# Patient Record
Sex: Male | Born: 1980 | Race: Black or African American | Hispanic: No | Marital: Married | Smoking: Never smoker
Health system: Southern US, Community
[De-identification: ages and names within clinical notes are randomized; demographics above are authoritative.]

## PROBLEM LIST (undated history)

## (undated) DIAGNOSIS — I10 Essential (primary) hypertension: Secondary | ICD-10-CM

## (undated) DIAGNOSIS — K219 Gastro-esophageal reflux disease without esophagitis: Secondary | ICD-10-CM

## (undated) HISTORY — DX: Gastro-esophageal reflux disease without esophagitis: K21.9

---

## 2001-01-19 ENCOUNTER — Emergency Department (HOSPITAL_COMMUNITY): Admission: EM | Admit: 2001-01-19 | Discharge: 2001-01-19 | Payer: Self-pay | Admitting: Emergency Medicine

## 2001-01-19 ENCOUNTER — Encounter: Payer: Self-pay | Admitting: Emergency Medicine

## 2004-08-22 ENCOUNTER — Emergency Department (HOSPITAL_COMMUNITY): Admission: EM | Admit: 2004-08-22 | Discharge: 2004-08-22 | Payer: Self-pay | Admitting: Family Medicine

## 2011-09-06 ENCOUNTER — Emergency Department (HOSPITAL_COMMUNITY)
Admission: EM | Admit: 2011-09-06 | Discharge: 2011-09-06 | Disposition: A | Discharge status: Home / Self Care | Payer: Self-pay | Attending: Emergency Medicine | Admitting: Emergency Medicine

## 2011-09-06 ENCOUNTER — Encounter (HOSPITAL_COMMUNITY): Payer: Self-pay | Admitting: Emergency Medicine

## 2011-09-06 DIAGNOSIS — K5289 Other specified noninfective gastroenteritis and colitis: Secondary | ICD-10-CM | POA: Insufficient documentation

## 2011-09-06 DIAGNOSIS — R630 Anorexia: Secondary | ICD-10-CM | POA: Insufficient documentation

## 2011-09-06 DIAGNOSIS — R1031 Right lower quadrant pain: Secondary | ICD-10-CM | POA: Insufficient documentation

## 2011-09-06 DIAGNOSIS — R197 Diarrhea, unspecified: Secondary | ICD-10-CM | POA: Insufficient documentation

## 2011-09-06 DIAGNOSIS — R112 Nausea with vomiting, unspecified: Secondary | ICD-10-CM | POA: Insufficient documentation

## 2011-09-06 DIAGNOSIS — K529 Noninfective gastroenteritis and colitis, unspecified: Secondary | ICD-10-CM

## 2011-09-06 LAB — COMPREHENSIVE METABOLIC PANEL
ALT: 13 U/L (ref 0–53)
AST: 18 U/L (ref 0–37)
Albumin: 4 g/dL (ref 3.5–5.2)
Alkaline Phosphatase: 86 U/L (ref 39–117)
Calcium: 10.1 mg/dL (ref 8.4–10.5)
GFR calc Af Amer: >90 mL/min (ref >90)
Glucose, Bld: 96 mg/dL (ref 70–99)
Potassium: 4.1 mEq/L (ref 3.5–5.1)
Sodium: 136 mEq/L (ref 135–145)
Total Protein: 7.9 g/dL (ref 6.0–8.3)

## 2011-09-06 LAB — DIFFERENTIAL
Basophils Absolute: 0.1 10*3/uL (ref 0.0–0.1)
Basophils Relative: 1 % (ref 0–1)
Eosinophils Absolute: 0.3 10*3/uL (ref 0.0–0.7)
Eosinophils Relative: 2 % (ref 0–5)
Lymphs Abs: 3.5 10*3/uL (ref 0.7–4.0)
Neutrophils Relative %: 60 % (ref 43–77)

## 2011-09-06 LAB — CBC
MCH: 29.8 pg (ref 26.0–34.0)
MCHC: 35.4 g/dL (ref 30.0–36.0)
MCV: 84.2 fL (ref 78.0–100.0)
Platelets: 268 10*3/uL (ref 150–400)
RBC: 5.57 MIL/uL (ref 4.22–5.81)
RDW: 12.9 % (ref 11.5–15.5)

## 2011-09-06 LAB — URINALYSIS, ROUTINE W REFLEX MICROSCOPIC
Bilirubin Urine: NEGATIVE
Glucose, UA: NEGATIVE mg/dL
Ketones, ur: NEGATIVE mg/dL
Nitrite: NEGATIVE
Specific Gravity, Urine: 1.021 (ref 1.005–1.030)
pH: 6 (ref 5.0–8.0)

## 2011-09-06 MED ORDER — SODIUM CHLORIDE 0.9 % IV BOLUS (SEPSIS): 1000.0000 mL | Freq: Once | INTRAVENOUS | Status: DC

## 2011-09-06 MED ORDER — ONDANSETRON HCL 4 MG/2ML IJ SOLN
4.0000 mg | Freq: Once | INTRAMUSCULAR | Status: AC
  Administered 2011-09-06: 4 mg via INTRAVENOUS
  Filled 2011-09-06: qty 2

## 2011-09-06 NOTE — ED Notes (Signed)
Pt tolerating oral trial

## 2011-09-06 NOTE — ED Notes (Signed)
Pt c/o n/v/d that began on new year's eve and has persisted since then.  He is worried about possible dehydration. A/O x 3.  No c/o pain at this time. Some tenderness noted on palpation to the RUQ.  Pt has had no previous abdominal disease processes or surgeries.

## 2011-09-06 NOTE — ED Provider Notes (Signed)
4:37 PM Patient placed in CDU for completion of diagnostic testing and IV fluids.  Patient with history of nausea, vomiting, diarrhea.  Last episode of emesis and diarrhea this morning.  Labs reviewed, no acute abnormality noted.  Patient resting comfortably at present, IV bolus in progress.  7:04 PM  Patient tolerating po fluids without difficulty.  Continues to be free of abdominal pain.  Patient states he feels ready to return home.   Jimmye Norman, NP 09/06/11 1905

## 2011-09-06 NOTE — ED Notes (Signed)
Report given and care endorsed to [facility], [place]. Pt prepared for transport.

## 2011-09-06 NOTE — ED Provider Notes (Signed)
History     CSN: [id number]  Arrival date & time 09/06/11  1151   First MD Initiated Contact with Patient 09/06/11 1505      Chief Complaint  Patient presents with  . Abdominal Pain    (Consider location/radiation/quality/duration/timing/severity/associated sxs/prior treatment) HPI Patient presents with 3 days of nausea vomiting and diarrhea. He states that he has had intermittent pain in the right side of his abdomen over the past 3 days as well. He denies having any pain currently. Emesis is nonbilious and nonbloody. He has had no fever. He has had 2 episodes of emesis earlier today and has not been able to keep down liquids. He describes 4 episodes of watery stool overnight. There's no blood in his stool. He does not know of any sick contacts. He denies any significant past medical history and denies any new medications. There no associated systemic symptoms. There no other alleviating or modifying factors.  Symptoms are described as continuous, and mild to moderate  History reviewed. No pertinent past medical history.  History reviewed. No pertinent past surgical history.  History reviewed. No pertinent family history.  History  Substance Use Topics  . Smoking status: Never Smoker   . Smokeless tobacco: Never Used  . Alcohol Use: Yes      Review of Systems ROS reviewed and otherwise negative except for mentioned in HPI  Allergies  Review of patient's allergies indicates no known allergies.  Home Medications   Current Outpatient Rx  Name Route Sig Dispense Refill  . DIPHENHYDRAMINE HCL 25 MG PO TABS Oral Take 25 mg by mouth every 8 (eight) hours as needed. Allergies      . LOPERAMIDE HCL 2 MG PO CAPS Oral Take 2 mg by mouth 4 (four) times daily as needed. Diarrhea.     . OMEPRAZOLE 20 MG PO CPDR Oral Take 20 mg by mouth daily as needed. Acid reflux       BP 136/84  Pulse 63  Temp(Src) 98.4 F (36.9 C) (Oral)  Resp 20  Ht 5\' 10"  (1.778 m)  Wt 150 lb (68.04 kg)   BMI 21.52 kg/m2  SpO2 99% Vitals reviewed Physical Exam Physical Examination: General appearance - alert, well appearing, and in no distress Mental status - alert, oriented to person, place, and time Eyes - pupils equal and reactive, extraocular eye movements intact Mouth - mucous membranes moist, pharynx normal without lesions Chest - clear to auscultation, no wheezes, rales or rhonchi, symmetric air entry Heart - normal rate, regular rhythm, normal S1, S2, no murmurs, rubs, clicks or gallops Abdomen - soft, minimal ttp in RLQ, no gaurding, no rebound, nondistended, no masses or organomegaly Musculoskeletal - no joint tenderness, deformity or swelling Extremities - peripheral pulses normal, no pedal edema, no clubbing or cyanosis Skin - normal coloration and turgor, no rashes  ED Course  Procedures (including critical care time) 3:54 PM pt moved to CDU, report given to Felicie Morn- who will continue management under my supervision.   Labs Reviewed  CBC - Abnormal; Notable for the following:    WBC 11.4 (*)    All other components within normal limits  DIFFERENTIAL  COMPREHENSIVE METABOLIC PANEL  URINALYSIS, ROUTINE W REFLEX MICROSCOPIC  LIPASE, BLOOD   No results found.   1. Gastroenteritis       MDM  Patient presenting with nausea vomiting and diarrhea. He has had intermittent cramping lower abdominal pain as well. He was hydrated with IV saline as well as given antibiotics. His  labs were reassuring. He tolerated fluid challenge in the ED prior to discharge and was feeling improved. He was discharged with strict return precautions and is agreeable with the plan.        Ethelda Chick, MD 09/06/11 1950

## 2011-09-06 NOTE — ED Provider Notes (Signed)
Medical screening examination/treatment/procedure(s) were conducted as a shared visit with non-physician practitioner(s) and myself.  I personally evaluated the patient during the encounter  Pt was seen primarily by me in triage.   Ethelda Chick, MD 09/06/11 [phone]

## 2011-09-06 NOTE — ED Notes (Signed)
Pt discharged home with family and friends. All sections of the discharge instructions were discussed. Pt stated that he understood and had no questions/concerns.

## 2011-09-06 NOTE — ED Notes (Signed)
Abdominal pain with n/v/d since New Years Eve

## 2019-03-18 ENCOUNTER — Other Ambulatory Visit: Payer: Self-pay | Admitting: Internal Medicine

## 2019-03-21 LAB — NOVEL CORONAVIRUS, NAA: SARS-CoV-2, NAA: NOT DETECTED

## 2019-06-25 ENCOUNTER — Encounter (HOSPITAL_COMMUNITY): Payer: Self-pay

## 2019-06-25 ENCOUNTER — Other Ambulatory Visit: Payer: Self-pay

## 2019-06-25 ENCOUNTER — Ambulatory Visit (HOSPITAL_COMMUNITY)
Admission: EM | Admit: 2019-06-25 | Discharge: 2019-06-25 | Disposition: A | Discharge status: Home / Self Care | Payer: Self-pay | Attending: Family Medicine | Admitting: Family Medicine

## 2019-06-25 DIAGNOSIS — R05 Cough: Secondary | ICD-10-CM | POA: Insufficient documentation

## 2019-06-25 DIAGNOSIS — U071 COVID-19: Secondary | ICD-10-CM | POA: Insufficient documentation

## 2019-06-25 DIAGNOSIS — R6883 Chills (without fever): Secondary | ICD-10-CM | POA: Insufficient documentation

## 2019-06-25 DIAGNOSIS — R059 Cough, unspecified: Secondary | ICD-10-CM

## 2019-06-25 DIAGNOSIS — I10 Essential (primary) hypertension: Secondary | ICD-10-CM | POA: Insufficient documentation

## 2019-06-25 MED ORDER — CLONIDINE HCL 0.1 MG PO TABS
ORAL_TABLET | ORAL | Status: AC
  Filled 2019-06-25: qty 1

## 2019-06-25 MED ORDER — CLONIDINE HCL 0.1 MG PO TABS
0.1000 mg | ORAL_TABLET | Freq: Once | ORAL | Status: AC
  Administered 2019-06-25: 14:00:00 0.1 mg via ORAL

## 2019-06-25 MED ORDER — AMLODIPINE BESYLATE 5 MG PO TABS: 5.0000 mg | ORAL_TABLET | Freq: Every day | ORAL | 0 refills | Status: DC

## 2019-06-25 NOTE — Discharge Instructions (Signed)
Your Covid swab is pending, we will call if it is positive In the meantime please rest, drink fluids, Tylenol for body aches  Your blood pressure was elevated today in clinic.  We gave you clonidine.  Please start amlodipine 5 mg daily, go ahead and contact primary care either this afternoon or in the morning to get set up with a follow-up appointment in the next 1 to 2 weeks.  Please go to Emergency Room if you start to experience severe headache, vision changes, decreased urine production, chest pain, shortness of breath, speech slurring, one sided weakness.

## 2019-06-25 NOTE — ED Provider Notes (Signed)
[name]    CSN: [id number] Arrival date & time: 06/25/19  1235      History   Chief Complaint Chief Complaint  Patient presents with  . Chills    HPI Jeremiah [name] is a 38 y.o. male history of hypertension presenting today for evaluation of chills, lack of appetite.  Patient states that for the past 6 days he has had decreased appetite as well as hot and cold chills.  He has had a mild cough as well.  He denies any known measured fevers.  Denies any known exposure to Covid.  Patient did recently travel to and from [place].  He has been using Hall's as well as Coricidin for his cold symptoms.  Of note his blood pressure is very elevated today.  He does admit that he has a history of hypertension.  Has previously been on medicines, but states that he has not taken them since March as he was told he no longer needed medicines.  He is unsure of which medicines he was previously on.  He does believe he is taking amlodipine before without issue.  He denies headaches, vision changes, chest pain or shortness of breath.  He does have some occasional intermittent mild headaches that are located retro-ocularly.    HPI  History reviewed. No pertinent past medical history.  There are no active problems to display for this patient.   History reviewed. No pertinent surgical history.     Home Medications    Prior to Admission medications   Medication Sig Start Date End Date Taking? Authorizing Provider  amLODipine (NORVASC) 5 MG tablet Take 1 tablet (5 mg total) by mouth daily. 06/25/19   Wieters, Hallie C, PA-C  diphenhydrAMINE (BENADRYL) 25 MG tablet Take 25 mg by mouth every 8 (eight) hours as needed. Allergies      [provider]  loperamide (IMODIUM) 2 MG capsule Take 2 mg by mouth 4 (four) times daily as needed. Diarrhea.     [provider]  omeprazole (PRILOSEC) 20 MG capsule Take 20 mg by mouth daily as needed. Acid reflux     [provider]    Family History Family History  Problem Relation Age of Onset  . Hypertension Father     Social History Social History   Tobacco Use  . Smoking status: Never Smoker  . Smokeless tobacco: Never Used  Substance Use Topics  . Alcohol use: Yes  . Drug use: No     Allergies   Patient has no known allergies.   Review of Systems Review of Systems  Constitutional: Positive for appetite change, chills and fatigue. Negative for activity change and fever.  HENT: Negative for congestion, ear pain, rhinorrhea, sinus pressure, sore throat and trouble swallowing.   Eyes: Negative for photophobia, pain, discharge, redness and visual disturbance.  Respiratory: Positive for cough. Negative for chest tightness and shortness of breath.   Cardiovascular: Negative for chest pain.  Gastrointestinal: Negative for abdominal pain, diarrhea, nausea and vomiting.  Genitourinary: Negative for decreased urine volume and hematuria.  Musculoskeletal: Negative for myalgias, neck pain and neck stiffness.  Skin: Negative for rash.  Neurological: Negative for dizziness, syncope, facial asymmetry, speech difficulty, weakness, light-headedness, numbness and headaches.     Physical Exam Triage Vital Signs ED Triage Vitals  Enc Vitals Group     BP 06/25/19 1312 (!) 192/135     Pulse Rate 06/25/19 1312 89     Resp 06/25/19 1312 18  Temp 06/25/19 1312 98.1 F (36.7 C)     Temp Source 06/25/19 1312 Tympanic     SpO2 06/25/19 1312 96 %     Weight --      Height --      Head Circumference --      Peak Flow --      Pain Score 06/25/19 1317 0     Pain Loc --      Pain Edu? --      Excl. in GC? --    No data found.  Updated Vital Signs BP (!) 192/135 Comment: bp recheck  Pulse 89   Temp 98.1 F (36.7 C) (Tympanic)   Resp 18   SpO2 96%   Visual Acuity Right Eye Distance:   Left Eye Distance:   Bilateral Distance:    Right Eye Near:   Left Eye Near:    Bilateral Near:      Physical Exam Vitals signs and nursing note reviewed.  Constitutional:      Appearance: He is well-developed.  HENT:     Head: Normocephalic and atraumatic.     Ears:     Comments: Bilateral ears without tenderness to palpation of external auricle, tragus and mastoid, EAC's without erythema or swelling, TM's with good bony landmarks and cone of light. Non erythematous.     Mouth/Throat:     Comments: Oral mucosa pink and moist, no tonsillar enlargement or exudate. Posterior pharynx patent and nonerythematous, no uvula deviation or swelling. Normal phonation.  Eyes:     Extraocular Movements: Extraocular movements intact.     Conjunctiva/sclera: Conjunctivae normal.     Pupils: Pupils are equal, round, and reactive to light.  Neck:     Musculoskeletal: Neck supple.  Cardiovascular:     Rate and Rhythm: Normal rate and regular rhythm.     Heart sounds: No murmur.  Pulmonary:     Effort: Pulmonary effort is normal. No respiratory distress.     Breath sounds: Normal breath sounds.     Comments: Breathing comfortably at rest, CTABL, no wheezing, rales or other adventitious sounds auscultated Abdominal:     Palpations: Abdomen is soft.     Tenderness: There is no abdominal tenderness.  Skin:    General: Skin is warm and dry.  Neurological:     General: No focal deficit present.     Mental Status: He is alert. Mental status is at baseline.     Cranial Nerves: No cranial nerve deficit.     Motor: No weakness.     Gait: Gait normal.      UC Treatments / Results  Labs (all labs ordered are listed, but only abnormal results are displayed) Labs Reviewed  NOVEL CORONAVIRUS, NAA (HOSP ORDER, SEND-OUT TO REF LAB; TAT 18-24 HRS)    EKG   Radiology No results found.  Procedures Procedures (including critical care time)  Medications Ordered in UC Medications  cloNIDine (CATAPRES) tablet 0.1 mg (0.1 mg Oral Given 06/25/19 1350)  cloNIDine (CATAPRES) 0.1 MG tablet (has no  administration in time range)  cloNIDine (CATAPRES) 0.1 MG tablet (has no administration in time range)    Initial Impression / Assessment and Plan / UC Course  I have reviewed the triage vital signs and the nursing notes.  Pertinent labs & imaging results that were available during my care of the patient were reviewed by me and considered in my medical decision making (see chart for details).     Patient with mild URI  symptoms along with chills and decreased appetite, Covid swab pending.  Recommending symptomatic and supportive care of this.  Continue to monitor for gradual resolution.  Blood pressure significantly elevated today, provided clonidine 0.1 in clinic, monitored for approximately 30 minutes, no immediate improvement in blood pressure, patient expressed desire to leave.  Discussed risks associated with this, but given patient is relatively asymptomatic currently we will have him continue to monitor his blood pressure at home which he verbalized understanding to do.  Initiating on amlodipine 5 mg daily, stressed importance of establishing care with primary care for blood pressure recheck and hypertension management as he likely needs an increased dose/additional medicine for better control.  Discussed warning signs and red flags to follow-up in emergency room.  Discussed strict return precautions. Patient verbalized understanding and is agreeable with plan.  Final Clinical Impressions(s) / UC Diagnoses   Final diagnoses:  Chills  Essential hypertension  Cough     Discharge Instructions     Your Covid swab is pending, we will call if it is positive In the meantime please rest, drink fluids, Tylenol for body aches  Your blood pressure was elevated today in clinic.  We gave you clonidine.  Please start amlodipine 5 mg daily, go ahead and contact primary care either this afternoon or in the morning to get set up with a follow-up appointment in the next 1 to 2 weeks.  Please go  to Emergency Room if you start to experience severe headache, vision changes, decreased urine production, chest pain, shortness of breath, speech slurring, one sided weakness.    ED Prescriptions    Medication Sig Dispense Auth. Provider   amLODipine (NORVASC) 5 MG tablet Take 1 tablet (5 mg total) by mouth daily. 60 tablet Wieters, [place] C, PA-C     PDMP not reviewed this encounter.   Lew DawesWieters, Hallie C, [place]PA-C 06/25/19 1519

## 2019-06-25 NOTE — ED Triage Notes (Signed)
Patient presents to Urgent Care with complaints of loss of taste and smell and occasional chills and hot flashes since 6 days ago. Patient reports he just returned from [place].

## 2019-06-27 ENCOUNTER — Emergency Department (HOSPITAL_COMMUNITY)
Admission: EM | Admit: 2019-06-27 | Discharge: 2019-06-27 | Disposition: A | Discharge status: Home / Self Care | Payer: Self-pay | Attending: Emergency Medicine | Admitting: Emergency Medicine

## 2019-06-27 ENCOUNTER — Other Ambulatory Visit: Payer: Self-pay

## 2019-06-27 ENCOUNTER — Encounter (HOSPITAL_COMMUNITY): Payer: Self-pay

## 2019-06-27 ENCOUNTER — Emergency Department (HOSPITAL_COMMUNITY): Payer: Self-pay

## 2019-06-27 DIAGNOSIS — U071 COVID-19: Secondary | ICD-10-CM | POA: Insufficient documentation

## 2019-06-27 LAB — NOVEL CORONAVIRUS, NAA (HOSP ORDER, SEND-OUT TO REF LAB; TAT 18-24 HRS): SARS-CoV-2, NAA: DETECTED — AB

## 2019-06-27 MED ORDER — BENZONATATE 100 MG PO CAPS: 100.0000 mg | ORAL_CAPSULE | Freq: Three times a day (TID) | ORAL | 0 refills | Status: DC

## 2019-06-27 NOTE — ED Notes (Signed)
Patient did not wait for discharge papers and left through main ED entrance and not EMS bay. Patient was very disappointed that he had to wait roughly 20 to 30 minutes to be discharged due to RN providing care to 2 additional COVID + patients.

## 2019-06-27 NOTE — ED Notes (Signed)
Pt called from lobby with no response. 

## 2019-06-27 NOTE — Discharge Instructions (Addendum)
Please try Delsym over-the-counter for cough relief.  If that still does not help, you may take [employer], as prescribed.  Please also take the Norvasc that was prescribed to you by the urgent care provider for your high blood pressure.  Return to the ED or seek medical attention should he develop any fever uncontrolled with Tylenol or ibuprofen, uncontrolled nausea and vomiting, dizziness, chest pain, or respiratory distress.   If you live with, or provide care at home for, a person confirmed to have, or being evaluated for, COVID-19 infection please follow these guidelines to prevent infection:  Follow healthcare providers instructions Make sure that you understand and can help the patient follow any healthcare provider instructions for all care.  Provide for the patients basic needs You should help the patient with basic needs in the home and provide support for getting groceries, prescriptions, and other personal needs.  Monitor the patients symptoms If they are getting sicker, call his or her medical provider a  This will help the healthcare providers office take steps to keep other people from getting infected. Ask the healthcare provider to call the local or state health department.  Limit the number of people who have contact with the patient If possible, have only one caregiver for the patient. Other household members should stay in another home or place of residence. If this is not possible, they should stay in another room, or be separated from the patient as much as possible. Use a separate bathroom, if available. Restrict visitors who do not have an essential need to be in the home.  Keep older adults, very young children, and other sick people away from the patient Keep older adults, very young children, and those who have compromised immune systems or chronic health conditions away from the patient. This includes people with chronic heart, lung, or kidney  conditions, diabetes, and cancer.  Ensure good ventilation Make sure that shared spaces in the home have good air flow, such as from an air conditioner or an opened window, weather permitting.  Wash your hands often Wash your hands often and thoroughly with soap and water for at least 20 seconds. You can use an alcohol based hand sanitizer if soap and water are not available and if your hands are not visibly dirty. Avoid touching your eyes, nose, and mouth with unwashed hands. Use disposable paper towels to dry your hands. If not available, use dedicated cloth towels and replace them when they become wet.  Wear a facemask and gloves Wear a disposable facemask at all times in the room and gloves when you touch or have contact with the patients blood, body fluids, and/or secretions or excretions, such as sweat, saliva, sputum, nasal mucus, vomit, urine, or feces.  Ensure the mask fits over your nose and mouth tightly, and do not touch it during use. Throw out disposable facemasks and gloves after using them. Do not reuse. Wash your hands immediately after removing your facemask and gloves. If your personal clothing becomes contaminated, carefully remove clothing and launder. Wash your hands after handling contaminated clothing. Place all used disposable facemasks, gloves, and other waste in a lined container before disposing them with other household waste. Remove gloves and wash your hands immediately after handling these items.  Do not share dishes, glasses, or other household items with the patient Avoid sharing household items. You should not share dishes, drinking glasses, cups, eating utensils, towels, bedding, or other items After the person uses these items, you should  wash them thoroughly with soap and water.  Wash laundry thoroughly Immediately remove and wash clothes or bedding that have blood, body fluids, and/or secretions or excretions, such as sweat, saliva, sputum, nasal mucus,  vomit, urine, or feces, on them. Wear gloves when handling laundry from the patient. Read and follow directions on labels of laundry or clothing items and detergent. In general, wash and dry with the warmest temperatures recommended on the label.  Clean all areas the individual has used often Clean all touchable surfaces, such as counters, tabletops, doorknobs, bathroom fixtures, toilets, phones, keyboards, tablets, and bedside tables, every day. Also, clean any surfaces that may have blood, body fluids, and/or secretions or excretions on them. Wear gloves when cleaning surfaces the patient has come in contact with. Use a diluted bleach solution (e.g., dilute bleach with 1 part bleach and 10 parts water) or a household disinfectant with a label that says EPA-registered for coronaviruses. To make a bleach solution at home, add 1 tablespoon of bleach to 1 quart (4 cups) of water. For a larger supply, add  cup of bleach to 1 gallon (16 cups) of water. Read labels of cleaning products and follow recommendations provided on product labels. Labels contain instructions for safe and effective use of the cleaning product including precautions you should take when applying the product, such as wearing gloves or eye protection and making sure you have good ventilation during use of the product. Remove gloves and wash hands immediately after cleaning.  Monitor yourself for signs and symptoms of illness Caregivers and household members are considered close contacts, should monitor their health, and will be asked to limit movement outside of the home to the extent possible. Follow the monitoring steps for close contacts listed on the symptom monitoring form.   ? If you have additional questions, contact your local health department or call the epidemiologist on call at [phone] (available 24/7). ? This guidance is subject to change. For the most up-to-date guidance from [facility], please refer to their  website: [employer]

## 2019-06-27 NOTE — ED Triage Notes (Signed)
Pt states that he recently returned from travel in [place]. Pt states that he coughs everytime he deep breathes. Pt was unaware at start of triage, but informed that he is COVID +. Pt states no taste.

## 2019-06-27 NOTE — ED Provider Notes (Addendum)
[facility]   CSN: [id number] Arrival date & time: 06/27/19  1058     History   Chief Complaint Chief Complaint  Patient presents with  . Cough  . COVID+    HPI Jeremiah [name] is a 38 y.o. male with past medical history significant for hypertension who presents to the ED with reports of 1 day history of pleuritic chest pain with deep inspiration.  He reports feeling sick for approximately 8 days and was swabbed 2 days ago at an urgent care for COVID-19 and tested positive.  He lives at home with his husband and reports that they are taking isolation measures.  He reports pain with deep inspiration, particularly following a cough, but is not taking any antitussive medications.  He had been taking Coricidin, but is no longer doing so.  He also endorses loss of taste.  He does not have a PCP at this time.  He denies any fevers, chills, dizziness, headache, squeezing chest pain, respiratory distress, abdominal discomfort, nausea, vomiting, or other symptoms.    HPI  History reviewed. No pertinent past medical history.  There are no active problems to display for this patient.   History reviewed. No pertinent surgical history.      Home Medications    Prior to Admission medications   Medication Sig Start Date End Date Taking? Authorizing Provider  amLODipine (NORVASC) 5 MG tablet Take 1 tablet (5 mg total) by mouth daily. 06/25/19  Yes Wieters, Hallie C, PA-C  diphenhydrAMINE (BENADRYL) 25 MG tablet Take 25 mg by mouth every 8 (eight) hours as needed for itching or allergies.    Yes [provider]  omeprazole (PRILOSEC) 20 MG capsule Take 20 mg by mouth daily as needed (acid reflux).    Yes [provider]  benzonatate (TESSALON) 100 MG capsule Take 1 capsule (100 mg total) by mouth every 8 (eight) hours. 06/27/19   Lorelee New, PA-C    Family History Family History  Problem Relation Age of Onset  .  Hypertension Father     Social History Social History   Tobacco Use  . Smoking status: Never Smoker  . Smokeless tobacco: Never Used  Substance Use Topics  . Alcohol use: Yes  . Drug use: No     Allergies   Patient has no known allergies.   Review of Systems Review of Systems  All other systems reviewed and are negative.    Physical Exam Updated Vital Signs BP (!) 190/139   Pulse 85   Temp 99.5 F (37.5 C) (Oral)   Resp 18   Wt 75.7 kg   SpO2 95%   BMI 23.93 kg/m   Physical Exam Vitals signs and nursing note reviewed. Exam conducted with a chaperone present.  Constitutional:      Appearance: Normal appearance.  HENT:     Head: Normocephalic and atraumatic.  Eyes:     General: No scleral icterus.    Conjunctiva/sclera: Conjunctivae normal.  Neck:     Musculoskeletal: Normal range of motion and neck supple. No neck rigidity.  Cardiovascular:     Rate and Rhythm: Normal rate and regular rhythm.     Pulses: Normal pulses.     Heart sounds: Normal heart sounds.  Pulmonary:     Effort: Pulmonary effort is normal. No respiratory distress.     Breath sounds: Normal breath sounds.     Comments: Breath sounds intact bilaterally. Abdominal:     General: There is  no distension.     Palpations: Abdomen is soft.     Tenderness: There is no abdominal tenderness.  Skin:    General: Skin is dry.  Neurological:     Mental Status: He is alert.     GCS: GCS eye subscore is 4. GCS verbal subscore is 5. GCS motor subscore is 6.  Psychiatric:        Mood and Affect: Mood normal.        Behavior: Behavior normal.        Thought Content: Thought content normal.      ED Treatments / Results  Labs (all labs ordered are listed, but only abnormal results are displayed) Labs Reviewed - No data to display  EKG None  Radiology Dg Chest Portable 1 View  Result Date: 06/27/2019 CLINICAL DATA:  Cough.  COVID-19 positive EXAM: PORTABLE CHEST 1 VIEW COMPARISON:  None.  FINDINGS: Lungs are clear. Heart is upper normal in size with pulmonary vascularity normal. No adenopathy. No bone lesions. IMPRESSION: No edema or consolidation. Heart upper normal in size. No adenopathy evident. Electronically Signed   By: Lowella Grip III M.D.   On: 06/27/2019 15:45    Procedures Procedures (including critical care time)  Medications Ordered in ED Medications - No data to display   Initial Impression / Assessment and Plan / ED Course  I have reviewed the triage vital signs and the nursing notes.  Pertinent labs & imaging results that were available during my care of the patient were reviewed by me and considered in my medical decision making (see chart for details).       Will obtain DG portable chest to help rule out spontaneous pneumothorax or pneumonia given patient's report of new onset pleuritic chest pain with deep inspiration.  Plain films were reviewed and demonstrate no evidence of focal consolidation concerning for pneumonia or evidence of pneumothorax.  Patient is approximately day 8 or 9 intercourse and I informed him that that is typically when symptoms peak.   Patient denies any history of clots or any history of clotting disorder, cancer, or HIV.  He denies any lower leg swelling or hemoptysis.  He is neither tachycardic nor tachypneic and he is oxygenating 99% on room air.  He is PERC negative and do not believe that CTA pulmonary is necessary at this time.  Recommend that he try Delsym OTC for cough relief.  We will also prescribe him Ladona Ridgel given his lack of outpatient follow-up.  Instructed to continue Tylenol as needed for any fevers and chills.  Patient had also been prescribed Norvasc 2 days ago by [employer] and I encouraged him to take those as prescribed for his high blood pressure.  Return to the ED or seek medical attention should he develop any fever uncontrolled with Tylenol or ibuprofen, uncontrolled nausea and vomiting, dizziness,  chest pain, or respiratory distress.  Provided strict isolation protocol.  Patient voiced understanding and is agreeable plan.  Patient had high blood pressure at time of discharge, but patient was entirely asymptomatic and plans to take his antihypertensive that were just recently prescribed to him 2 days ago as soon as he is home.   Final Clinical Impressions(s) / ED Diagnoses   Final diagnoses:  COVID-19    ED Discharge Orders         Ordered    benzonatate (TESSALON) 100 MG capsule  Every 8 hours     06/27/19 1448  Lorelee NewGreen, Ysabella Babiarz L, PA-C 06/27/19 1602    Lorelee NewGreen, Ronold Hardgrove L, PA-C 06/27/19 1606    Gerhard MunchLockwood, Robert, MD 06/27/19 [phone]

## 2019-06-29 ENCOUNTER — Telehealth (HOSPITAL_COMMUNITY): Payer: Self-pay | Admitting: Emergency Medicine

## 2019-06-29 NOTE — Telephone Encounter (Signed)
Positive covid, Attempted to contact patient to see how he was feeling, no answer. Pt was notified by ER staff.

## 2019-07-13 ENCOUNTER — Other Ambulatory Visit: Payer: Self-pay

## 2019-07-13 DIAGNOSIS — Z20822 Contact with and (suspected) exposure to covid-19: Secondary | ICD-10-CM

## 2019-07-16 LAB — NOVEL CORONAVIRUS, NAA: SARS-CoV-2, NAA: NOT DETECTED

## 2019-11-02 DIAGNOSIS — E876 Hypokalemia: Secondary | ICD-10-CM

## 2019-11-02 DIAGNOSIS — I169 Hypertensive crisis, unspecified: Secondary | ICD-10-CM

## 2019-11-02 DIAGNOSIS — E785 Hyperlipidemia, unspecified: Secondary | ICD-10-CM

## 2019-11-02 DIAGNOSIS — R7989 Other specified abnormal findings of blood chemistry: Secondary | ICD-10-CM

## 2019-11-02 DIAGNOSIS — E559 Vitamin D deficiency, unspecified: Secondary | ICD-10-CM

## 2019-11-02 HISTORY — DX: Vitamin D deficiency, unspecified: E55.9

## 2019-11-02 HISTORY — DX: Hypertensive crisis, unspecified: I16.9

## 2019-11-02 HISTORY — DX: Other specified abnormal findings of blood chemistry: R79.89

## 2019-11-02 HISTORY — DX: Hyperlipidemia, unspecified: E78.5

## 2019-11-02 HISTORY — DX: Hypokalemia: E87.6

## 2019-11-24 ENCOUNTER — Emergency Department (HOSPITAL_COMMUNITY)
Admission: EM | Admit: 2019-11-24 | Discharge: 2019-11-25 | Disposition: A | Discharge status: Home / Self Care | Payer: 59 | Attending: Emergency Medicine | Admitting: Emergency Medicine

## 2019-11-24 ENCOUNTER — Encounter (HOSPITAL_COMMUNITY): Payer: Self-pay

## 2019-11-24 ENCOUNTER — Other Ambulatory Visit: Payer: Self-pay

## 2019-11-24 ENCOUNTER — Encounter (HOSPITAL_COMMUNITY): Payer: Self-pay | Admitting: Emergency Medicine

## 2019-11-24 ENCOUNTER — Ambulatory Visit (HOSPITAL_COMMUNITY)
Admission: EM | Admit: 2019-11-24 | Discharge: 2019-11-24 | Disposition: A | Discharge status: Home / Self Care | Payer: 59 | Source: Home / Self Care | Attending: Family Medicine | Admitting: Family Medicine

## 2019-11-24 DIAGNOSIS — R519 Headache, unspecified: Secondary | ICD-10-CM

## 2019-11-24 DIAGNOSIS — I160 Hypertensive urgency: Secondary | ICD-10-CM | POA: Diagnosis not present

## 2019-11-24 DIAGNOSIS — R42 Dizziness and giddiness: Secondary | ICD-10-CM | POA: Diagnosis not present

## 2019-11-24 DIAGNOSIS — I10 Essential (primary) hypertension: Secondary | ICD-10-CM | POA: Diagnosis not present

## 2019-11-24 DIAGNOSIS — R350 Frequency of micturition: Secondary | ICD-10-CM | POA: Diagnosis not present

## 2019-11-24 HISTORY — DX: Essential (primary) hypertension: I10

## 2019-11-24 LAB — COMPREHENSIVE METABOLIC PANEL
ALT: 17 U/L (ref 0–44)
AST: 21 U/L (ref 15–41)
Albumin: 3.5 g/dL (ref 3.5–5.0)
Alkaline Phosphatase: 102 U/L (ref 38–126)
Anion gap: 12 (ref 5–15)
BUN: 24 mg/dL — ABNORMAL HIGH (ref 6–20)
CO2: 25 mmol/L (ref 22–32)
Calcium: 9.6 mg/dL (ref 8.9–10.3)
Chloride: 98 mmol/L (ref 98–111)
Creatinine, Ser: 2.63 mg/dL — ABNORMAL HIGH (ref 0.61–1.24)
GFR calc Af Amer: 34 mL/min — ABNORMAL LOW (ref >60)
GFR calc non Af Amer: 30 mL/min — ABNORMAL LOW (ref >60)
Glucose, Bld: 118 mg/dL — ABNORMAL HIGH (ref 70–99)
Potassium: 3 mmol/L — ABNORMAL LOW (ref 3.5–5.1)
Sodium: 135 mmol/L (ref 135–145)
Total Bilirubin: 0.5 mg/dL (ref 0.3–1.2)
Total Protein: 6.9 g/dL (ref 6.5–8.1)

## 2019-11-24 LAB — CBC WITH DIFFERENTIAL/PLATELET
Abs Immature Granulocytes: 0.06 10*3/uL (ref 0.00–0.07)
Basophils Absolute: 0.1 10*3/uL (ref 0.0–0.1)
Basophils Relative: 1 %
Eosinophils Absolute: 0.6 10*3/uL — ABNORMAL HIGH (ref 0.0–0.5)
Eosinophils Relative: 4 %
HCT: 45.9 % (ref 39.0–52.0)
Hemoglobin: 16.3 g/dL (ref 13.0–17.0)
Immature Granulocytes: 0 %
Lymphocytes Relative: 26 %
Lymphs Abs: 3.7 10*3/uL (ref 0.7–4.0)
MCH: 28.9 pg (ref 26.0–34.0)
MCHC: 35.5 g/dL (ref 30.0–36.0)
MCV: 81.4 fL (ref 80.0–100.0)
Monocytes Absolute: 1.2 10*3/uL — ABNORMAL HIGH (ref 0.1–1.0)
Monocytes Relative: 8 %
Neutro Abs: 8.6 10*3/uL — ABNORMAL HIGH (ref 1.7–7.7)
Neutrophils Relative %: 61 %
Platelets: 333 10*3/uL (ref 150–400)
RBC: 5.64 MIL/uL (ref 4.22–5.81)
RDW: 13.7 % (ref 11.5–15.5)
WBC: 14.3 10*3/uL — ABNORMAL HIGH (ref 4.0–10.5)
nRBC: 0 % (ref 0.0–0.2)

## 2019-11-24 MED ORDER — CLONIDINE HCL 0.1 MG PO TABS
ORAL_TABLET | ORAL | Status: AC
  Filled 2019-11-24: qty 1

## 2019-11-24 MED ORDER — CLONIDINE HCL 0.1 MG PO TABS
0.1000 mg | ORAL_TABLET | Freq: Once | ORAL | Status: AC
  Administered 2019-11-24: 0.1 mg via ORAL

## 2019-11-24 NOTE — ED Notes (Signed)
Patient is being discharged from the Urgent Care Center and sent to the Emergency Department via wheelchair by staff. Per dr hagler, patient is stable but in need of higher level of care due to uncontrolled hypertension. Patient is aware and verbalizes understanding of plan of care.  Vitals:   11/24/19 1905 11/24/19 1906  BP: (!) 211/144 (!) 223/147  Pulse: 88   Resp: 16   Temp:    SpO2: 99%

## 2019-11-24 NOTE — ED Triage Notes (Signed)
Pt presents with ongoing elevated blood pressure and migraine that sometimes radiates down into his neck X 2 months; pt states he takes his meds as directed and still doesn't get much relief.

## 2019-11-24 NOTE — ED Provider Notes (Signed)
[facility]   [id number] 11/24/19 Arrival Time: 1651  ASSESSMENT & PLAN:  1. Hypertensive urgency   2. Intractable headache, unspecified chronicity pattern, unspecified headache type     Meds ordered this encounter  Medications  . cloNIDine (CATAPRES) tablet 0.1 mg   See blood pressures below. No response to clonidine. Still with headache that he describes as worse than normal. Recommend ED evaluation. Taken to ED via wheelchair; stable on discharge here.   Vitals:   11/24/19 1737 11/24/19 1812 11/24/19 1905 11/24/19 1906  BP: (!) 200/125 (!) 196/131 (!) 211/144 (!) 223/147  Pulse: 88 88 88   Resp: 16  16   Temp: 98.2 F (36.8 C)     TempSrc: Oral     SpO2: 99%  99%     Follow-up Information    Go to  [facility] EMERGENCY DEPARTMENT.   Specialty: Emergency Medicine Contact information: [address] [id number] [place] [address] [phone]          Reviewed expectations re: course of current medical issues. Questions answered. Outlined signs and symptoms indicating need for more acute intervention. Patient verbalized understanding. After Visit Summary given.   SUBJECTIVE:  Jeremiah [name] is a 39 y.o. male who presents with concerns regarding increased blood pressures over the past six months. Taking 5mg  amlodipine. Persistent occipital headaches for the past two months; somewhat worse today prompting him to come in for blood pressure check and evaluation. No visual changes. No aphasia or ataxia. No extremity sensation changes or weakness.  He reports taking medications as instructed, no medication side effects noted, no chest pain on exertion, no dyspnea on exertion, no swelling of ankles, no orthostatic dizziness or lightheadedness, no orthopnea or paroxysmal nocturnal dyspnea, no palpitations and no intermittent claudication symptoms.   Social History   Tobacco Use  Smoking Status Never Smoker    Smokeless Tobacco Never Used     OBJECTIVE:  Vitals:   11/24/19 1737 11/24/19 1812 11/24/19 1905 11/24/19 1906  BP: (!) 200/125 (!) 196/131 (!) 211/144 (!) 223/147  Pulse: 88 88 88   Resp: 16  16   Temp: 98.2 F (36.8 C)     TempSrc: Oral     SpO2: 99%  99%     General appearance: alert; no distress Eyes: PERRLA; EOMI HENT: normocephalic; atraumatic Neck: supple Lungs: speaks full sentences without difficulty; unlabored Heart: regular Abdomen: soft, non-tender; bowel sounds normal Extremities: no edema; symmetrical with no gross deformities Skin: warm and dry Neuro: CN 2-12 grossly intact; moving extremities normally; normal speech Psychological: alert and cooperative; normal mood and affect  No Known Allergies  Past Medical History:  Diagnosis Date  . Hypertension    Social History   Socioeconomic History  . Marital status: Significant Other    Spouse name: Not on file  . Number of children: Not on file  . Years of education: Not on file  . Highest education level: Not on file  Occupational History  . Not on file  Tobacco Use  . Smoking status: Never Smoker  . Smokeless tobacco: Never Used  Substance and Sexual Activity  . Alcohol use: Yes  . Drug use: No  . Sexual activity: Not on file  Other Topics Concern  . Not on file  Social History Narrative  . Not on file   Social Determinants of Health   Financial Resource Strain:   . Difficulty of Paying Living Expenses:   Food Insecurity:   .  Worried About Charity fundraiser in the Last Year:   . Arboriculturist in the Last Year:   Transportation Needs:   . Film/video editor (Medical):   Marland Kitchen Lack of Transportation (Non-Medical):   Physical Activity:   . Days of Exercise per Week:   . Minutes of Exercise per Session:   Stress:   . Feeling of Stress :   Social Connections:   . Frequency of Communication with Friends and Family:   . Frequency of Social Gatherings with Friends and Family:   .  Attends Religious Services:   . Active Member of Clubs or Organizations:   . Attends Archivist Meetings:   Marland Kitchen Marital Status:   Intimate Partner Violence:   . Fear of Current or Ex-Partner:   . Emotionally Abused:   Marland Kitchen Physically Abused:   . Sexually Abused:    Family History  Problem Relation Age of Onset  . Hypertension Father    History reviewed. No pertinent surgical history.    Vanessa Kick, MD 11/25/19 1015

## 2019-11-24 NOTE — ED Triage Notes (Signed)
Patient reports persistent headache for 2 months with elevated blood pressure , denies head injury , no emesis or photophobia , no neuro deficits , denies fever or chills .

## 2019-11-25 ENCOUNTER — Emergency Department (HOSPITAL_COMMUNITY)
Admission: EM | Admit: 2019-11-25 | Discharge: 2019-11-25 | Disposition: A | Discharge status: Home / Self Care | Payer: 59 | Source: Home / Self Care | Attending: Emergency Medicine | Admitting: Emergency Medicine

## 2019-11-25 ENCOUNTER — Encounter (HOSPITAL_COMMUNITY): Payer: Self-pay

## 2019-11-25 ENCOUNTER — Other Ambulatory Visit: Payer: Self-pay

## 2019-11-25 DIAGNOSIS — E876 Hypokalemia: Secondary | ICD-10-CM

## 2019-11-25 DIAGNOSIS — N179 Acute kidney failure, unspecified: Secondary | ICD-10-CM

## 2019-11-25 DIAGNOSIS — I10 Essential (primary) hypertension: Secondary | ICD-10-CM

## 2019-11-25 LAB — URINALYSIS, ROUTINE W REFLEX MICROSCOPIC
Bacteria, UA: NONE SEEN
Bilirubin Urine: NEGATIVE
Glucose, UA: NEGATIVE mg/dL
Hgb urine dipstick: NEGATIVE
Ketones, ur: NEGATIVE mg/dL
Leukocytes,Ua: NEGATIVE
Nitrite: NEGATIVE
Protein, ur: 100 mg/dL — AB
Specific Gravity, Urine: 1.011 (ref 1.005–1.030)
pH: 7 (ref 5.0–8.0)

## 2019-11-25 MED ORDER — AMLODIPINE BESYLATE 5 MG PO TABS
5.0000 mg | ORAL_TABLET | Freq: Once | ORAL | Status: AC
  Administered 2019-11-25: 5 mg via ORAL
  Filled 2019-11-25: qty 1

## 2019-11-25 MED ORDER — AMLODIPINE BESYLATE 10 MG PO TABS: 10.0000 mg | ORAL_TABLET | Freq: Every day | ORAL | 0 refills | Status: DC

## 2019-11-25 MED ORDER — POTASSIUM CHLORIDE CRYS ER 20 MEQ PO TBCR
40.0000 meq | EXTENDED_RELEASE_TABLET | Freq: Once | ORAL | Status: AC
  Administered 2019-11-25: 11:00:00 40 meq via ORAL
  Filled 2019-11-25: qty 2

## 2019-11-25 MED ORDER — CARVEDILOL 6.25 MG PO TABS: 6.2500 mg | ORAL_TABLET | Freq: Two times a day (BID) | ORAL | 0 refills | Status: DC

## 2019-11-25 MED ORDER — LISINOPRIL 10 MG PO TABS
10.0000 mg | ORAL_TABLET | Freq: Once | ORAL | Status: AC
  Administered 2019-11-25: 10 mg via ORAL
  Filled 2019-11-25: qty 1

## 2019-11-25 NOTE — ED Notes (Signed)
No answer for vitals recheck x1 

## 2019-11-25 NOTE — TOC Initial Note (Signed)
Transition of Care [facility]) - Initial/Assessment Note    Patient Details  Name: Jeremiah [name] MRN: [id number] Date of Birth: November 18, 1980  [facility] CM/SW Contact:    Elliot Cousin, RN Phone Number: [phone] 11/25/2019, 12:41 PM  Clinical Narrative:                 TOC CM spoke to pt and provided information on appt at [facility] on 11/30/2019 at 9:20 am. Pt is self employed. Education provided on HTN, diet and medications.   Expected Discharge Plan: Home/Self Care Barriers to Discharge: No Barriers Identified   Patient Goals and CMS Choice        Expected Discharge Plan and Services Expected Discharge Plan: Home/Self Care In-house Referral: PCP / [facility] Discharge Planning Services: CM Consult   Living arrangements for the past 2 months: Apartment                                      Prior Living Arrangements/Services Living arrangements for the past 2 months: Apartment Lives with:: Significant Other Patient language and need for interpreter reviewed:: Yes Do you feel safe going back to the place where you live?: Yes      Need for Family Participation in Patient Care: No (Comment) Care giver support system in place?: No (comment)   Criminal Activity/Legal Involvement Pertinent to Current Situation/Hospitalization: No - Comment as needed  Activities of Daily Living      Permission Sought/Granted Permission sought to share information with : Case Manager, PCP, Family Supports Permission granted to share information with : Yes, Verbal Permission Granted  Share Information with NAME: Docia Chuck  Permission granted to share info w AGENCY: PCP  Permission granted to share info w Relationship: Significant Other  Permission granted to share info w Contact Information: [phone]  Emotional Assessment Appearance:: Appears stated age Attitude/Demeanor/Rapport: Engaged Affect (typically observed):  Accepting Orientation: : Oriented to Self, Oriented to Place, Oriented to Situation Alcohol / Substance Use: Never Used Psych Involvement: No (comment)  Admission diagnosis:  headaches There are no problems to display for this patient.  PCP:  Patient, No Pcp Per Pharmacy:   [id number] - [place], [place] - [address] [place] [address] Phone: [phone] Fax: [phone]     Social Determinants of Health (SDOH) Interventions    Readmission Risk Interventions No flowsheet data found.

## 2019-11-25 NOTE — ED Notes (Signed)
No answer for room x2 

## 2019-11-25 NOTE — ED Notes (Signed)
Pt denies chest pain, HA, blurred vision or dizziness. A/ox3. Skin w/d/pink. resp wnl, equal and non-labored. Speech clear. NAD. No complaints voiced.

## 2019-11-25 NOTE — ED Notes (Signed)
Called x 3 with no answer.

## 2019-11-25 NOTE — ED Provider Notes (Signed)
[facility] Provider Note   CSN: [id number] Arrival date & time: 11/25/19  1006     History Chief Complaint  Patient presents with  . Headache  . Hypertension    Jeremiah [name] is a 39 y.o. male with a history of hypertension presenting to the emergency department with high blood pressure.  Patient reports he has been having episodes of posterior headaches for the past 2 months.  He says he had Covid in October and felt like this may have been a sequelae of his illness.  He does not have a blood pressure cuff at home and had not been checking his pressures (until he bought one today).  He had been taking 5 mg of amlodipine daily.    He went to urgent care yesterday evening was found to have significantly elevated blood pressure, ranging 200-210 systolic and 110-140 diastolic per our records.  He was given a dose of clonidine and told to go to the ER for possible hypertensive urgency.  He went to [facility] ED, where he had blood work drawn in triage, but then left after spending 5 hours in the lobby waiting for a bed.  His labs from yesterday  were notable for K+ 3.0, Cr 2.63, normal LFT's, glucose 118.  CBC w/ normal hgb, WBC 14.3.  This morning he comes back to the emergency department to follow-up on his visit from yesterday.  He states he no longer has a headache.  He denies ever having blurred vision, nausea, vomiting, numbness or weakness.  He denies any chest pain or shortness of breath. He does report urinary frequency for the past several months.  He reports a family history of high blood pressure and diabetes, although he is adopted and does not know much more about his biological family.  He does not smoke, or use any recreational drugs including cocaine.  HPI     Past Medical History:  Diagnosis Date  . Hypertension     There are no problems to display for this patient.   History reviewed. No pertinent surgical history.     Family  History  Problem Relation Age of Onset  . Hypertension Father     Social History   Tobacco Use  . Smoking status: Never Smoker  . Smokeless tobacco: Never Used  Substance Use Topics  . Alcohol use: Yes  . Drug use: No    Home Medications Prior to Admission medications   Medication Sig Start Date End Date Taking? Authorizing Provider  Aspirin-Salicylamide-Caffeine (BC HEADACHE POWDER PO) Take 1 packet by mouth as needed (headache/pain).   Yes [provider]  diphenhydrAMINE (BENADRYL) 25 MG tablet Take 25 mg by mouth every 8 (eight) hours as needed for itching or allergies.    Yes [provider]  omeprazole (PRILOSEC) 20 MG capsule Take 20 mg by mouth daily as needed (acid reflux).    Yes [provider]  amLODipine (NORVASC) 10 MG tablet Take 1 tablet (10 mg total) by mouth daily. 11/25/19 12/25/19  Terald Sleeper, MD  benzonatate (TESSALON) 100 MG capsule Take 1 capsule (100 mg total) by mouth every 8 (eight) hours. Patient not taking: Reported on 11/25/2019 06/27/19   Lorelee New, PA-C  carvedilol (COREG) 6.25 MG tablet Take 1 tablet (6.25 mg total) by mouth 2 (two) times daily with a meal. 11/25/19 12/25/19  Everet Flagg, Kermit Balo, MD    Allergies    Patient has no known allergies.  Review of Systems  Review of Systems  Constitutional: Negative for chills and fever.  Eyes: Negative for photophobia and visual disturbance.  Respiratory: Negative for cough and shortness of breath.   Cardiovascular: Negative for chest pain and palpitations.  Gastrointestinal: Negative for abdominal pain, nausea and vomiting.  Genitourinary: Positive for frequency. Negative for dysuria.  Musculoskeletal: Negative for neck pain and neck stiffness.  Skin: Negative for color change and rash.  Neurological: Positive for light-headedness and headaches. Negative for seizures, syncope, facial asymmetry, speech difficulty, weakness and numbness.  All other systems reviewed  and are negative.   Physical Exam Updated Vital Signs BP (!) 177/133   Pulse 72   Temp 98.4 F (36.9 C) (Oral)   Resp 17   Ht 5\' 10"  (1.778 m)   Wt 65.8 kg   SpO2 100%   BMI 20.81 kg/m   Physical Exam Vitals and nursing note reviewed.  Constitutional:      Appearance: He is well-developed.  HENT:     Head: Normocephalic and atraumatic.  Eyes:     Extraocular Movements: Extraocular movements intact.     Conjunctiva/sclera: Conjunctivae normal.     Pupils: Pupils are equal, round, and reactive to light.  Cardiovascular:     Rate and Rhythm: Normal rate and regular rhythm.     Heart sounds: Normal heart sounds.  Pulmonary:     Effort: Pulmonary effort is normal. No respiratory distress.  Musculoskeletal:     Cervical back: Normal range of motion and neck supple.  Skin:    General: Skin is warm and dry.  Neurological:     Mental Status: He is alert.     GCS: GCS eye subscore is 4. GCS verbal subscore is 5. GCS motor subscore is 6.     Cranial Nerves: Cranial nerves are intact. No cranial nerve deficit, dysarthria or facial asymmetry.     Motor: Motor function is intact.     Gait: Gait is intact.     ED Results / Procedures / Treatments   Labs (all labs ordered are listed, but only abnormal results are displayed) Labs Reviewed  URINALYSIS, ROUTINE W REFLEX MICROSCOPIC - Abnormal; Notable for the following components:      Result Value   Protein, ur 100 (*)    All other components within normal limits    EKG EKG Interpretation  Date/Time:  Wednesday November 25 2019 10:57:15 EDT Ventricular Rate:  72 PR Interval:    QRS Duration: 91 QT Interval:  416 QTC Calculation: 456 R Axis:   75 Text Interpretation: Sinus rhythm Biatrial enlargement LVH with secondary repolarization abnormality No STEMI Confirmed by Octaviano Glow [phone]) on 11/25/2019 11:09:16 AM   Radiology No results found.  Procedures Procedures (including critical care time)  Medications  Ordered in ED Medications  amLODipine (NORVASC) tablet 5 mg (5 mg Oral Given 11/25/19 1047)  potassium chloride SA (KLOR-CON) CR tablet 40 mEq (40 mEq Oral Given 11/25/19 1047)  lisinopril (ZESTRIL) tablet 10 mg (10 mg Oral Given 11/25/19 1047)    ED Course  I have reviewed the triage vital signs and the nursing notes.  Pertinent labs & imaging results that were available during my care of the patient were reviewed by me and considered in my medical decision making (see chart for details).  39 yo male presenting to the ED today with asymptomatic hypertension, after being seen in urgent care yesterday for hypertension associated with headache.  He has no symptoms in the ED.  His blood pressure is still  elevated though not as dramatically as yesterday evening.  I suspect there may be a strong genetic component to this elevation in BP.    With no active symptoms, I do not believe he is having a hypertensive emergency, but I am quite concerned about his elevated blood pressure.  I explained to him that high blood pressure can affect the brain, the heart, and the kidneys, and it is certainly possible he is suffering from headaches due to this.  I do not believe he is having a stroke or ICH, fortunately, and I do not believe he needs an emergent CT scan at this time.  His labs were concerning for an AKI - Cr elevated, likely related to his HTN.  Urine with protein in it.  ECG shows signs of LVH.  He has no chest pain or SOB. I don't think he's having ischemic episodes requiring troponin at this time.  We gave him PO amlodipine 10 mg and added lisinopril 10 mg here in the ED.  After discussion with Dr Cristal Deer of cardiology, however, I decided to discharge him on coreg instead, given his kidney function.  He'll continue amlodipine and add coreg 6.25 mg BID.  Our case manager helped set him up with rapid PCP appointment this week, and we reviewed how he should monitor his BP at home.  Finally, K+ at  3.0, likely related to kidney injury, and doubtful this was an acute change.  He uses K+ supplements occasionally at home, I advised he resume daily until his PCP can recheck his level.  Clinical Course as of Nov 24 1848  Wed Nov 25, 2019  1248 Last blood pressure 166/128, still elevated but this unfortunately may be his baseline for the time being.  He still asymptomatic.  Our case manager seen the patient and given a brochure and set him up with a follow-up appointment in 5 days with a new primary care provider.  I also discussed the case with Dr. Cristal Deer of cardiology who again stressed to the patient and establish care with a primary care provider, but also stated that the patient will be well can be managed in the cardiology office for his hypertension.  He may need multiple agents.  Dr. Cristal Deer had recommended using Coreg as a secondary agent as it may be more renal protective and some of the other medications.  I discussed the entire follow-up plan and care plan with the patient and his partner at the bedside.  They verbalized understanding.   [MT]    Clinical Course User Index [MT] Terald Sleeper, MD    Final Clinical Impression(s) / ED Diagnoses Final diagnoses:  Hypertension, unspecified type  Acute kidney injury (HCC)  Hypokalemia    Rx / DC Orders ED Discharge Orders         Ordered    amLODipine (NORVASC) 10 MG tablet  Daily     11/25/19 1247    carvedilol (COREG) 6.25 MG tablet  2 times daily with meals     11/25/19 1247           Terald Sleeper, MD 11/25/19 1851

## 2019-11-25 NOTE — ED Triage Notes (Signed)
Patient c/o headache and elevated BP. Patient states his BP at [facility] yesterday.

## 2019-11-25 NOTE — Discharge Instructions (Addendum)
You were seen in our emergency department very high blood pressure, and found to have a kidney injury on your blood work.  You were also found to have mildly low potassium (level 3.0), likely related to your kidney injury.  We discussed the need for CLOSE management of your blood pressure as an outpatient.  You will need to establish care with a primary care provider, as well as a cardiologist.  You should make appointments with both.  Our case manager set you up with a PCP appointment on 11/30/19 at 9:20 AM, and gave you a brochure in the room.  It is very important you make this appointment. Your primary care provider should recheck your blood pressure, kidney function, and potassium.  In the meantime, continue recording your blood pressure daily at home using your cuff.  Check it twice per day, once in the morning and once before bedtime.  Keep a daily log to bring to the doctor's office.  You will INCREASE your amlodipine dose from 5 mg to 10 mg daily.  You will also start taking carvedilol (coreg) 6.25 mg twice a day (once in the morning, once at night).  This was recommended by our cardiologist.  This medicine is a beta blocker, meaning it can slow your heart rate down.  If you feel you are becoming lightheaded, or dizzy, while taking it, please stop taking this medicine.  For your low potassium, go back to using your home potassium pills daily for the next month, or until your doctor tells you to stop using them.  Finally, at home, try to avoid salty food (high in sodium), which can raise your blood pressure.

## 2019-11-30 ENCOUNTER — Encounter: Payer: Self-pay | Admitting: Family Medicine

## 2019-11-30 ENCOUNTER — Other Ambulatory Visit: Payer: Self-pay

## 2019-11-30 ENCOUNTER — Ambulatory Visit (INDEPENDENT_AMBULATORY_CARE_PROVIDER_SITE_OTHER): Payer: 59 | Admitting: Family Medicine

## 2019-11-30 VITALS — BP 148/94 | HR 84 | Ht 69.0 in | Wt 166.8 lb

## 2019-11-30 DIAGNOSIS — Z0000 Encounter for general adult medical examination without abnormal findings: Secondary | ICD-10-CM | POA: Diagnosis not present

## 2019-11-30 DIAGNOSIS — Z09 Encounter for follow-up examination after completed treatment for conditions other than malignant neoplasm: Secondary | ICD-10-CM

## 2019-11-30 DIAGNOSIS — R519 Headache, unspecified: Secondary | ICD-10-CM

## 2019-11-30 DIAGNOSIS — I10 Essential (primary) hypertension: Secondary | ICD-10-CM | POA: Diagnosis not present

## 2019-11-30 DIAGNOSIS — R739 Hyperglycemia, unspecified: Secondary | ICD-10-CM | POA: Insufficient documentation

## 2019-11-30 DIAGNOSIS — G8929 Other chronic pain: Secondary | ICD-10-CM | POA: Insufficient documentation

## 2019-11-30 DIAGNOSIS — Z7689 Persons encountering health services in other specified circumstances: Secondary | ICD-10-CM | POA: Diagnosis not present

## 2019-11-30 DIAGNOSIS — I169 Hypertensive crisis, unspecified: Secondary | ICD-10-CM | POA: Insufficient documentation

## 2019-11-30 DIAGNOSIS — R7303 Prediabetes: Secondary | ICD-10-CM | POA: Insufficient documentation

## 2019-11-30 LAB — POCT GLYCOSYLATED HEMOGLOBIN (HGB A1C): Hemoglobin A1C: 6.3 % — AB (ref 4.0–5.6)

## 2019-11-30 LAB — GLUCOSE, POCT (MANUAL RESULT ENTRY): POC Glucose: 203 mg/dl — AB (ref 70–99)

## 2019-11-30 MED ORDER — CLONIDINE HCL 0.1 MG PO TABS
0.1000 mg | ORAL_TABLET | Freq: Once | ORAL | Status: AC
  Administered 2019-11-30: 0.1 mg via ORAL

## 2019-11-30 MED ORDER — CLONIDINE HCL 0.1 MG PO TABS: 0.1000 mg | ORAL_TABLET | Freq: Three times a day (TID) | ORAL | 3 refills | Status: DC

## 2019-11-30 MED ORDER — CLONIDINE HCL 0.1 MG PO TABS
0.2000 mg | ORAL_TABLET | Freq: Once | ORAL | Status: AC
  Administered 2019-11-30: 0.2 mg via ORAL

## 2019-11-30 MED ORDER — LISINOPRIL 10 MG PO TABS: 10.0000 mg | ORAL_TABLET | Freq: Every day | ORAL | 3 refills | Status: DC

## 2019-11-30 NOTE — Progress Notes (Signed)
[facility]   New Patient--Hospital Follow Up--Establish Care  Subjective:  Patient ID: Jeremiah [name], male    DOB: 02-Mar-1981  Age: 39 y.o. MRN: [id number]  CC:  Chief Complaint  Patient presents with  . Hospitalization Follow-up    ED 11/25/2019 HTN  . New Patient (Initial Visit)    Est Care    HPI Jeremiah [name] is a 39 year old male who presents to Hospital Follow Up and to Establish Care today.   Past Medical History:  Diagnosis Date  . GERD (gastroesophageal reflux disease)   . Hypertension   . Hypertensive crisis 11/2019   Current Status: This will be his initial office visit with me. He was previously not seeing a PCP regularly for his PCP needs. Since his last office visit, he has had multiple ED visits for Hypertensive Crisis. Today, he continues to have elevated blood pressures. He states that she has had cluster headaches also for the passed few months. He admits to not drinking water often.  He denies visual changes, chest pain, cough, shortness of breath, heart palpitations, and falls. Denies severe headaches, confusion, seizures, double vision, and blurred vision, nausea and vomiting. His anxiety is mild today. He denies suicidal ideations, homicidal ideations, or auditory hallucinations. He denies fevers, chills, fatigue, recent infections, weight loss, and night sweats. No reports of GI problems such as diarrhea, and constipation. He has no reports of blood in stools, dysuria and hematuria. He is taking all medications as prescribed. He denies pain today.   History reviewed. No pertinent surgical history.  Family History  Problem Relation Age of Onset  . Hypertension Father     Social History   Socioeconomic History  . Marital status: Significant Other    Spouse name: Not on file  . Number of children: Not on file  . Years of education: Not on file  . Highest education level: Not on file  Occupational History  .  Not on file  Tobacco Use  . Smoking status: Never Smoker  . Smokeless tobacco: Never Used  Substance and Sexual Activity  . Alcohol use: Yes  . Drug use: No  . Sexual activity: Yes    Partners: Male  Other Topics Concern  . Not on file  Social History Narrative  . Not on file   Social Determinants of Health   Financial Resource Strain:   . Difficulty of Paying Living Expenses:   Food Insecurity:   . Worried About Programme researcher, broadcasting/film/video in the Last Year:   . Barista in the Last Year:   Transportation Needs:   . Freight forwarder (Medical):   Marland Kitchen Lack of Transportation (Non-Medical):   Physical Activity:   . Days of Exercise per Week:   . Minutes of Exercise per Session:   Stress:   . Feeling of Stress :   Social Connections:   . Frequency of Communication with Friends and Family:   . Frequency of Social Gatherings with Friends and Family:   . Attends Religious Services:   . Active Member of Clubs or Organizations:   . Attends Banker Meetings:   Marland Kitchen Marital Status:   Intimate Partner Violence:   . Fear of Current or Ex-Partner:   . Emotionally Abused:   Marland Kitchen Physically Abused:   . Sexually Abused:     Outpatient Medications Prior to Visit  Medication Sig Dispense Refill  . amLODipine (NORVASC) 10 MG tablet Take  1 tablet (10 mg total) by mouth daily. 30 tablet 0  . carvedilol (COREG) 6.25 MG tablet Take 1 tablet (6.25 mg total) by mouth 2 (two) times daily with a meal. 60 tablet 0  . diphenhydrAMINE (BENADRYL) 25 MG tablet Take 25 mg by mouth every 8 (eight) hours as needed for itching or allergies.     Marland Kitchen omeprazole (PRILOSEC) 20 MG capsule Take 20 mg by mouth daily as needed (acid reflux).     . Aspirin-Salicylamide-Caffeine (BC HEADACHE POWDER PO) Take 1 packet by mouth as needed (headache/pain).    . benzonatate (TESSALON) 100 MG capsule Take 1 capsule (100 mg total) by mouth every 8 (eight) hours. (Patient not taking: Reported on 11/25/2019) 21  capsule 0   No facility-administered medications prior to visit.    No Known Allergies  ROS Review of Systems  Constitutional: Negative.   HENT: Negative.   Eyes: Negative.   Respiratory: Negative.   Cardiovascular: Negative.   Gastrointestinal: Negative.   Endocrine: Negative.   Genitourinary: Negative.   Musculoskeletal: Negative.   Skin: Negative.   Allergic/Immunologic: Negative.   Neurological: Positive for dizziness (occasional ) and headaches (occasional ).  Hematological: Negative.   Psychiatric/Behavioral: Negative.       Objective:    Physical Exam  Constitutional: He is oriented to person, place, and time. He appears well-developed and well-nourished.  HENT:  Head: Normocephalic and atraumatic.  Eyes: Conjunctivae are normal.  Cardiovascular: Normal rate, regular rhythm, normal heart sounds and intact distal pulses.  Pulmonary/Chest: Effort normal and breath sounds normal.  Abdominal: Soft. Bowel sounds are normal.  Musculoskeletal:        General: Normal range of motion.     Cervical back: Normal range of motion and neck supple.  Neurological: He is alert and oriented to person, place, and time. He has normal reflexes.  Skin: Skin is warm and dry.  Psychiatric: He has a normal mood and affect. His behavior is normal. Judgment and thought content normal.  Nursing note and vitals reviewed.   BP (!) 148/94   Pulse 84   Ht 5\' 9"  (1.753 m)   Wt 166 lb 12.8 oz (75.7 kg)   BMI 24.63 kg/m  Wt Readings from Last 3 Encounters:  11/30/19 166 lb 12.8 oz (75.7 kg)  11/25/19 145 lb (65.8 kg)  11/24/19 165 lb 5.5 oz (75 kg)     Health Maintenance Due  Topic Date Due  . HIV Screening  Never done  . TETANUS/TDAP  Never done  . INFLUENZA VACCINE  Never done    There are no preventive care reminders to display for this patient.  No results found for: TSH Lab Results  Component Value Date   WBC 14.3 (H) 11/24/2019   HGB 16.3 11/24/2019   HCT 45.9  11/24/2019   MCV 81.4 11/24/2019   PLT 333 11/24/2019   Lab Results  Component Value Date   NA 135 11/24/2019   K 3.0 (L) 11/24/2019   CO2 25 11/24/2019   GLUCOSE 118 (H) 11/24/2019   BUN 24 (H) 11/24/2019   CREATININE 2.63 (H) 11/24/2019   BILITOT 0.5 11/24/2019   ALKPHOS 102 11/24/2019   AST 21 11/24/2019   ALT 17 11/24/2019   PROT 6.9 11/24/2019   ALBUMIN 3.5 11/24/2019   CALCIUM 9.6 11/24/2019   ANIONGAP 12 11/24/2019   No results found for: CHOL No results found for: HDL No results found for: LDLCALC No results found for: TRIG No results found for:  CHOLHDL Lab Results  Component Value Date   HGBA1C 6.3 (A) 11/30/2019     Assessment & Plan:   1. Hospital discharge follow-up  2. Encounter to establish care  3. Hypertensive crisis Blood pressures are elevated today. Clonidine 0.3 mg given to patient in office and blood pressures began to stabilize  He denies severe headaches, confusion, seizures, double vision, and blurred vision, nausea and vomiting. He will report to ED if he experiences these symptoms. Patient verbalized understanding.   - cloNIDine (CATAPRES) tablet 0.2 mg - cloNIDine (CATAPRES) tablet 0.1 mg  4. Hypertension We will initiate Lisinopril today.   5. Hyperglycemia  6. Prediabetes  7. Chronic nonintractable headache, unspecified headache type Stable today.   8. Health care maintenance - POCT glycosylated hemoglobin (Hb A1C) - POCT urinalysis dipstick - POCT glucose (manual entry) - TSH - Lipid Panel - Vitamin B12 - Vitamin D, 25-hydroxy  9. Follow up He will follow up in 1 week for blood pressure check only.    Meds ordered this encounter  Medications  . cloNIDine (CATAPRES) tablet 0.2 mg  . DISCONTD: cloNIDine (CATAPRES) 0.1 MG tablet    Sig: Take 1 tablet (0.1 mg total) by mouth 3 (three) times daily.    Dispense:  90 tablet    Refill:  3  . cloNIDine (CATAPRES) tablet 0.1 mg  . lisinopril (ZESTRIL) 10 MG tablet    Sig:  Take 1 tablet (10 mg total) by mouth daily.    Dispense:  30 tablet    Refill:  3    Orders Placed This Encounter  Procedures  . TSH  . Lipid Panel  . Vitamin B12  . Vitamin D, 25-hydroxy  . POCT glycosylated hemoglobin (Hb A1C)  . POCT urinalysis dipstick  . POCT glucose (manual entry)    Referral Orders  No referral(s) requested today    Kathe Becton,  MSN, FNP-BC [address] [place], [place] [id number] [phone] fax  Problem List Items Addressed This Visit    None    Visit Diagnoses    Hospital discharge follow-up    -  Primary   Encounter to establish care       Hypertensive crisis       Relevant Medications   cloNIDine (CATAPRES) tablet 0.2 mg (Completed)   cloNIDine (CATAPRES) tablet 0.1 mg (Completed)   lisinopril (ZESTRIL) 10 MG tablet   Hypertension, unspecified type       Relevant Medications   cloNIDine (CATAPRES) tablet 0.2 mg (Completed)   cloNIDine (CATAPRES) tablet 0.1 mg (Completed)   lisinopril (ZESTRIL) 10 MG tablet   Hyperglycemia       Prediabetes       Chronic nonintractable headache, unspecified headache type       Health care maintenance       Relevant Orders   POCT glycosylated hemoglobin (Hb A1C) (Completed)   POCT urinalysis dipstick   POCT glucose (manual entry) (Completed)   TSH   Lipid Panel   Vitamin B12   Vitamin D, 25-hydroxy   Follow up          Meds ordered this encounter  Medications  . cloNIDine (CATAPRES) tablet 0.2 mg  . DISCONTD: cloNIDine (CATAPRES) 0.1 MG tablet    Sig: Take 1 tablet (0.1 mg total) by mouth 3 (three) times daily.    Dispense:  90 tablet    Refill:  3  . cloNIDine (CATAPRES) tablet  0.1 mg  . lisinopril (ZESTRIL) 10 MG tablet    Sig: Take 1 tablet (10 mg total) by mouth daily.    Dispense:  30 tablet    Refill:  3    Follow-up: Return in about 1 week (around 12/07/2019).    Kallie Locks, FNP

## 2019-11-30 NOTE — Patient Instructions (Signed)
Lisinopril Tablets What is this medicine? LISINOPRIL (lyse IN oh pril) is an ACE inhibitor. It treats high blood pressure and heart failure. It can treat heart damage after a heart attack. This medicine may be used for other purposes; ask your health care provider or pharmacist if you have questions. COMMON BRAND NAME(S): Prinivil, Zestril What should I tell my health care provider before I take this medicine? They need to know if you have any of these conditions:  diabetes  heart or blood vessel disease  kidney disease  low blood pressure  previous swelling of the tongue, face, or lips with difficulty breathing, difficulty swallowing, hoarseness, or tightening of the throat  an unusual or allergic reaction to lisinopril, other ACE inhibitors, insect venom, foods, dyes, or preservatives  pregnant or trying to get pregnant  breast-feeding How should I use this medicine? Take this drug by mouth. Take it as directed on the prescription label at the same time every day. You can take it with or without food. If it upsets your stomach, take it with food. Keep taking it unless your health care provider tells you to stop. Talk to your health care provider about the use of this drug in children. While it may be prescribed for children as young as 6 for selected conditions, precautions do apply. Overdosage: If you think you have taken too much of this medicine contact a poison control center or emergency room at once. NOTE: This medicine is only for you. Do not share this medicine with others. What if I miss a dose? If you miss a dose, take it as soon as you can. If it is almost time for your next dose, take only that dose. Do not take double or extra doses. What may interact with this medicine? Do not take this medicine with any of the following medications:  hymenoptera venom  sacubitril; valsartan This medicines may also interact with the following  medications:  aliskiren  angiotensin receptor blockers, like losartan or valsartan  certain medicines for diabetes  diuretics  everolimus  gold compounds  lithium  NSAIDs, medicines for pain and inflammation, like ibuprofen or naproxen  potassium salts or supplements  salt substitutes  sirolimus  temsirolimus This list may not describe all possible interactions. Give your health care provider a list of all the medicines, herbs, non-prescription drugs, or dietary supplements you use. Also tell them if you smoke, drink alcohol, or use illegal drugs. Some items may interact with your medicine. What should I watch for while using this medicine? Visit your doctor or health care professional for regular check ups. Check your blood pressure as directed. Ask your doctor what your blood pressure should be, and when you should contact him or her. Do not treat yourself for coughs, colds, or pain while you are using this medicine without asking your doctor or health care professional for advice. Some ingredients may increase your blood pressure. Women should inform their doctor if they wish to become pregnant or think they might be pregnant. There is a potential for serious side effects to an unborn child. Talk to your health care professional or pharmacist for more information. Check with your doctor or health care professional if you get an attack of severe diarrhea, nausea and vomiting, or if you sweat a lot. The loss of too much body fluid can make it dangerous for you to take this medicine. You may get drowsy or dizzy. Do not drive, use machinery, or do anything that needs mental   alertness until you know how this drug affects you. Do not stand or sit up quickly, especially if you are an older patient. This reduces the risk of dizzy or fainting spells. Alcohol can make you more drowsy and dizzy. Avoid alcoholic drinks. Avoid salt substitutes unless you are told otherwise by your doctor or  health care professional. What side effects may I notice from receiving this medicine? Side effects that you should report to your doctor or health care professional as soon as possible:  allergic reactions like skin rash, itching or hives, swelling of the hands, feet, face, lips, throat, or tongue  breathing problems  signs and symptoms of kidney injury like trouble passing urine or change in the amount of urine  signs and symptoms of increased potassium like muscle weakness; chest pain; or fast, irregular heartbeat  signs and symptoms of liver injury like dark yellow or brown urine; general ill feeling or flu-like symptoms; light-colored stools; loss of appetite; nausea; right upper belly pain; unusually weak or tired; yellowing of the eyes or skin  signs and symptoms of low blood pressure like dizziness; feeling faint or lightheaded, falls; unusually weak or tired  stomach pain with or without nausea and vomiting Side effects that usually do not require medical attention (report to your doctor or health care professional if they continue or are bothersome):  changes in taste  cough  dizziness  fever  headache  sensitivity to light This list may not describe all possible side effects. Call your doctor for medical advice about side effects. You may report side effects to FDA at 1-800-FDA-1088. Where should I keep my medicine? Keep out of the reach of children and pets. Store at room temperature between 20 and 25 degrees C (68 and 77 degrees F). Protect from moisture. Keep the container tightly closed. Do not freeze. Avoid exposure to extreme heat. Throw away any unused drug after the expiration date. NOTE: This sheet is a summary. It may not cover all possible information. If you have questions about this medicine, talk to your doctor, pharmacist, or health care provider.  2020 Elsevier/Gold Standard (2019-03-25 11:38:35) Hypertension, Adult Hypertension is another name for  high blood pressure. High blood pressure forces your heart to work harder to pump blood. This can cause problems over time. There are two numbers in a blood pressure reading. There is a top number (systolic) over a bottom number (diastolic). It is best to have a blood pressure that is below 120/80. Healthy choices can help lower your blood pressure, or you may need medicine to help lower it. What are the causes? The cause of this condition is not known. Some conditions may be related to high blood pressure. What increases the risk?  Smoking.  Having type 2 diabetes mellitus, high cholesterol, or both.  Not getting enough exercise or physical activity.  Being overweight.  Having too much fat, sugar, calories, or salt (sodium) in your diet.  Drinking too much alcohol.  Having long-term (chronic) kidney disease.  Having a family history of high blood pressure.  Age. Risk increases with age.  Race. You may be at higher risk if you are African American.  Gender. Men are at higher risk than women before age 45. After age 65, women are at higher risk than men.  Having obstructive sleep apnea.  Stress. What are the signs or symptoms?  High blood pressure may not cause symptoms. Very high blood pressure (hypertensive crisis) may cause: ? Headache. ? Feelings of worry   or nervousness (anxiety). ? Shortness of breath. ? Nosebleed. ? A feeling of being sick to your stomach (nausea). ? Throwing up (vomiting). ? Changes in how you see. ? Very bad chest pain. ? Seizures. How is this treated?  This condition is treated by making healthy lifestyle changes, such as: ? Eating healthy foods. ? Exercising more. ? Drinking less alcohol.  Your health care provider may prescribe medicine if lifestyle changes are not enough to get your blood pressure under control, and if: ? Your top number is above 130. ? Your bottom number is above 80.  Your personal target blood pressure may  vary. Follow these instructions at home: Eating and drinking   If told, follow the DASH eating plan. To follow this plan: ? Fill one half of your plate at each meal with fruits and vegetables. ? Fill one fourth of your plate at each meal with whole grains. Whole grains include whole-wheat pasta, brown rice, and whole-grain bread. ? Eat or drink low-fat dairy products, such as skim milk or low-fat yogurt. ? Fill one fourth of your plate at each meal with low-fat (lean) proteins. Low-fat proteins include fish, chicken without skin, eggs, beans, and tofu. ? Avoid fatty meat, cured and processed meat, or chicken with skin. ? Avoid pre-made or processed food.  Eat less than 1,500 mg of salt each day.  Do not drink alcohol if: ? Your doctor tells you not to drink. ? You are pregnant, may be pregnant, or are planning to become pregnant.  If you drink alcohol: ? Limit how much you use to:  0-1 drink a day for women.  0-2 drinks a day for men. ? Be aware of how much alcohol is in your drink. In the [place] one drink equals one 12 oz bottle of beer (355 mL), one 5 oz glass of wine (148 mL), or one 1 oz glass of hard liquor (44 mL). Lifestyle   Work with your doctor to stay at a healthy weight or to lose weight. Ask your doctor what the best weight is for you.  Get at least 30 minutes of exercise most days of the week. This may include walking, swimming, or biking.  Get at least 30 minutes of exercise that strengthens your muscles (resistance exercise) at least 3 days a week. This may include lifting weights or doing Pilates.  Do not use any products that contain nicotine or tobacco, such as cigarettes, e-cigarettes, and chewing tobacco. If you need help quitting, ask your doctor.  Check your blood pressure at home as told by your doctor.  Keep all follow-up visits as told by your doctor. This is important. Medicines  Take over-the-counter and prescription medicines only as told by your  doctor. Follow directions carefully.  Do not skip doses of blood pressure medicine. The medicine does not work as well if you skip doses. Skipping doses also puts you at risk for problems.  Ask your doctor about side effects or reactions to medicines that you should watch for. Contact a doctor if you:  Think you are having a reaction to the medicine you are taking.  Have headaches that keep coming back (recurring).  Feel dizzy.  Have swelling in your ankles.  Have trouble with your vision. Get help right away if you:  Get a very bad headache.  Start to feel mixed up (confused).  Feel weak or numb.  Feel faint.  Have very bad pain in your: ? Chest. ? Belly (abdomen).  Throw   up more than once.  Have trouble breathing. Summary  Hypertension is another name for high blood pressure.  High blood pressure forces your heart to work harder to pump blood.  For most people, a normal blood pressure is less than 120/80.  Making healthy choices can help lower blood pressure. If your blood pressure does not get lower with healthy choices, you may need to take medicine. This information is not intended to replace advice given to you by your health care provider. Make sure you discuss any questions you have with your health care provider. Document Revised: 04/30/2018 Document Reviewed: 04/30/2018 Elsevier Patient Education  2020 Elsevier Inc.  

## 2019-12-01 ENCOUNTER — Other Ambulatory Visit: Payer: Self-pay | Admitting: Family Medicine

## 2019-12-01 ENCOUNTER — Encounter: Payer: Self-pay | Admitting: Family Medicine

## 2019-12-01 DIAGNOSIS — E559 Vitamin D deficiency, unspecified: Secondary | ICD-10-CM

## 2019-12-01 LAB — LIPID PANEL
Chol/HDL Ratio: 5.4 ratio — ABNORMAL HIGH (ref 0.0–5.0)
Cholesterol, Total: 227 mg/dL — ABNORMAL HIGH (ref 100–199)
HDL: 42 mg/dL (ref >39)
LDL Chol Calc (NIH): 158 mg/dL — ABNORMAL HIGH (ref 0–99)
Triglycerides: 149 mg/dL (ref 0–149)
VLDL Cholesterol Cal: 27 mg/dL (ref 5–40)

## 2019-12-01 LAB — TSH: TSH: 0.507 u[IU]/mL (ref 0.450–4.500)

## 2019-12-01 LAB — VITAMIN D 25 HYDROXY (VIT D DEFICIENCY, FRACTURES): Vit D, 25-Hydroxy: 9.6 ng/mL — ABNORMAL LOW (ref 30.0–100.0)

## 2019-12-01 LAB — VITAMIN B12: Vitamin B-12: 621 pg/mL (ref 232–1245)

## 2019-12-01 MED ORDER — VITAMIN D (ERGOCALCIFEROL) 1.25 MG (50000 UNIT) PO CAPS: 50000.0000 [IU] | ORAL_CAPSULE | ORAL | 6 refills | Status: DC

## 2019-12-07 ENCOUNTER — Ambulatory Visit: Payer: 59 | Admitting: Family Medicine

## 2019-12-11 ENCOUNTER — Encounter: Payer: 59 | Admitting: Family Medicine

## 2019-12-14 ENCOUNTER — Ambulatory Visit: Payer: 59

## 2019-12-14 ENCOUNTER — Other Ambulatory Visit: Payer: Self-pay

## 2019-12-14 ENCOUNTER — Ambulatory Visit: Payer: 59 | Admitting: Family Medicine

## 2019-12-14 DIAGNOSIS — I169 Hypertensive crisis, unspecified: Secondary | ICD-10-CM

## 2019-12-14 DIAGNOSIS — Z09 Encounter for follow-up examination after completed treatment for conditions other than malignant neoplasm: Secondary | ICD-10-CM

## 2019-12-14 NOTE — Progress Notes (Signed)
Today Mr. Jeremiah [name] was 132/89 hear rate 61.  Patient reported he is doing well and still checking it at home. Patient advised to keep doing so and keep follow up appointments.

## 2019-12-28 ENCOUNTER — Telehealth: Payer: Self-pay | Admitting: Nurse Practitioner

## 2019-12-28 ENCOUNTER — Other Ambulatory Visit: Payer: Self-pay | Admitting: Nurse Practitioner

## 2019-12-28 DIAGNOSIS — I10 Essential (primary) hypertension: Secondary | ICD-10-CM

## 2019-12-28 DIAGNOSIS — I169 Hypertensive crisis, unspecified: Secondary | ICD-10-CM

## 2019-12-28 MED ORDER — LISINOPRIL 10 MG PO TABS: 10.0000 mg | ORAL_TABLET | Freq: Every day | ORAL | 3 refills | Status: DC

## 2019-12-28 MED ORDER — AMLODIPINE BESYLATE 10 MG PO TABS: 10.0000 mg | ORAL_TABLET | Freq: Every day | ORAL | 0 refills | Status: DC

## 2019-12-28 MED ORDER — CARVEDILOL 6.25 MG PO TABS: 6.2500 mg | ORAL_TABLET | Freq: Two times a day (BID) | ORAL | 0 refills | Status: DC

## 2019-12-28 NOTE — Telephone Encounter (Signed)
Clonidine not refilled it looks like it was discontinued

## 2019-12-30 ENCOUNTER — Other Ambulatory Visit: Payer: Self-pay | Admitting: Family Medicine

## 2019-12-30 ENCOUNTER — Telehealth: Payer: Self-pay | Admitting: Family Medicine

## 2019-12-30 DIAGNOSIS — I10 Essential (primary) hypertension: Secondary | ICD-10-CM

## 2019-12-30 NOTE — Telephone Encounter (Signed)
Done

## 2020-01-04 ENCOUNTER — Encounter: Payer: Self-pay | Admitting: Family Medicine

## 2020-01-04 ENCOUNTER — Ambulatory Visit (INDEPENDENT_AMBULATORY_CARE_PROVIDER_SITE_OTHER): Payer: 59 | Admitting: Family Medicine

## 2020-01-04 ENCOUNTER — Other Ambulatory Visit: Payer: Self-pay

## 2020-01-04 VITALS — BP 128/90 | HR 70 | Temp 98.1°F | Ht 69.0 in | Wt 170.4 lb

## 2020-01-04 DIAGNOSIS — I10 Essential (primary) hypertension: Secondary | ICD-10-CM

## 2020-01-04 DIAGNOSIS — F419 Anxiety disorder, unspecified: Secondary | ICD-10-CM | POA: Diagnosis not present

## 2020-01-04 DIAGNOSIS — F4321 Adjustment disorder with depressed mood: Secondary | ICD-10-CM

## 2020-01-04 DIAGNOSIS — Z09 Encounter for follow-up examination after completed treatment for conditions other than malignant neoplasm: Secondary | ICD-10-CM

## 2020-01-04 DIAGNOSIS — R519 Headache, unspecified: Secondary | ICD-10-CM

## 2020-01-04 DIAGNOSIS — E559 Vitamin D deficiency, unspecified: Secondary | ICD-10-CM | POA: Diagnosis not present

## 2020-01-04 HISTORY — DX: Anxiety disorder, unspecified: F41.9

## 2020-01-04 MED ORDER — BUSPIRONE HCL 10 MG PO TABS: 10.0000 mg | ORAL_TABLET | Freq: Two times a day (BID) | ORAL | 1 refills | Status: DC

## 2020-01-04 MED ORDER — IBUPROFEN 600 MG PO TABS: 600.0000 mg | ORAL_TABLET | Freq: Three times a day (TID) | ORAL | 2 refills | Status: DC | PRN

## 2020-01-04 NOTE — Progress Notes (Signed)
[facility]    Established Patient Office Visit  Subjective:  Patient ID: Jeremiah [name], male    DOB: August 29, 1981  Age: 39 y.o. MRN: [id number]  CC:  Chief Complaint  Patient presents with  . Hospitalization Follow-up    htn    HPI Jeremiah [name] is a 39 year old male who presents for Follow Up today.   Past Medical History:  Diagnosis Date  . Anxiety 01/04/2020  . Elevated lipids 11/2019  . Elevated serum creatinine 11/2019  . GERD (gastroesophageal reflux disease)   . Hypertension   . Hypertensive crisis 11/2019  . Vitamin D deficiency 11/2019   Current Status: Since his last office visit, he is doing well with no complaints. He reports that he has been having increased anxiety r/t the death of his mother 2 years ago. He states that she becomes anxious at times. He denies visual changes, chest pain, cough, shortness of breath, heart palpitations, and falls. He has occasional headaches and dizziness with position changes. Denies severe headaches, confusion, seizures, double vision, and blurred vision, nausea and vomiting.    He denies fevers, chills, fatigue, recent infections, weight loss, and night sweats. He denies GI problems such as diarrhea, and constipation. He has no reports of blood in stools, dysuria and hematuria. He states that he has noticed that he has been having increased anxiety lately. He loss his mother 2 years ago and is coping. He denies suicidal ideations, homicidal ideations, or auditory hallucinations. He is taking all medications as prescribed. He denies pain today.    History reviewed. No pertinent surgical history.  Family History  Problem Relation Age of Onset  . Hypertension Father     Social History   Socioeconomic History  . Marital status: Significant Other    Spouse name: Not on file  . Number of children: Not on file  . Years of education: Not on file  . Highest education level: Not on  file  Occupational History  . Not on file  Tobacco Use  . Smoking status: Never Smoker  . Smokeless tobacco: Never Used  Substance and Sexual Activity  . Alcohol use: Yes  . Drug use: No  . Sexual activity: Yes    Partners: Male  Other Topics Concern  . Not on file  Social History Narrative  . Not on file   Social Determinants of Health   Financial Resource Strain:   . Difficulty of Paying Living Expenses:   Food Insecurity:   . Worried About Programme researcher, broadcasting/film/video in the Last Year:   . Barista in the Last Year:   Transportation Needs:   . Freight forwarder (Medical):   Marland Kitchen Lack of Transportation (Non-Medical):   Physical Activity:   . Days of Exercise per Week:   . Minutes of Exercise per Session:   Stress:   . Feeling of Stress :   Social Connections:   . Frequency of Communication with Friends and Family:   . Frequency of Social Gatherings with Friends and Family:   . Attends Religious Services:   . Active Member of Clubs or Organizations:   . Attends Banker Meetings:   Marland Kitchen Marital Status:   Intimate Partner Violence:   . Fear of Current or Ex-Partner:   . Emotionally Abused:   Marland Kitchen Physically Abused:   . Sexually Abused:     Outpatient Medications Prior to Visit  Medication Sig Dispense Refill  .  amLODipine (NORVASC) 10 MG tablet Take 1 tablet (10 mg total) by mouth daily. 30 tablet 0  . carvedilol (COREG) 6.25 MG tablet Take 1 tablet (6.25 mg total) by mouth 2 (two) times daily with a meal. 60 tablet 0  . cloNIDine (CATAPRES) 0.1 MG tablet Take 0.1 mg by mouth 3 (three) times daily.    . diphenhydrAMINE (BENADRYL) 25 MG tablet Take 25 mg by mouth every 8 (eight) hours as needed for itching or allergies.     Marland Kitchen lisinopril (ZESTRIL) 10 MG tablet Take 1 tablet (10 mg total) by mouth daily. 30 tablet 3  . omeprazole (PRILOSEC) 20 MG capsule Take 20 mg by mouth daily as needed (acid reflux).     . Vitamin D, Ergocalciferol, (DRISDOL) 1.25 MG (50000  UNIT) CAPS capsule Take 1 capsule (50,000 Units total) by mouth every 7 (seven) days. 5 capsule 6  . Aspirin-Salicylamide-Caffeine (BC HEADACHE POWDER PO) Take 1 packet by mouth as needed (headache/pain).     No facility-administered medications prior to visit.    No Known Allergies  ROS Review of Systems  Constitutional: Negative.   HENT: Negative.   Eyes: Negative.   Respiratory: Negative.   Cardiovascular: Negative.   Gastrointestinal: Negative.   Endocrine: Negative.   Genitourinary: Negative.   Musculoskeletal: Negative.   Skin: Negative.   Allergic/Immunologic: Negative.   Neurological: Positive for dizziness (occasional ) and headaches (occasional ).  Hematological: Negative.   Psychiatric/Behavioral: Negative.       Objective:    Physical Exam  Constitutional: He is oriented to person, place, and time. He appears well-developed and well-nourished.  HENT:  Head: Normocephalic and atraumatic.  Eyes: Conjunctivae are normal.  Cardiovascular: Normal rate, regular rhythm, normal heart sounds and intact distal pulses.  Pulmonary/Chest: Effort normal and breath sounds normal.  Abdominal: Soft. Bowel sounds are normal.  Musculoskeletal:        General: Normal range of motion.     Cervical back: Normal range of motion and neck supple.  Neurological: He is alert and oriented to person, place, and time. He has normal reflexes.  Skin: Skin is warm and dry.  Psychiatric: He has a normal mood and affect. His behavior is normal. Judgment and thought content normal.  Nursing note and vitals reviewed.   BP 128/90   Pulse 70   Temp 98.1 F (36.7 C)   Ht 5\' 9"  (1.753 m)   Wt 170 lb 6.4 oz (77.3 kg)   SpO2 100%   BMI 25.16 kg/m  Wt Readings from Last 3 Encounters:  01/04/20 170 lb 6.4 oz (77.3 kg)  11/30/19 166 lb 12.8 oz (75.7 kg)  11/25/19 145 lb (65.8 kg)     Health Maintenance Due  Topic Date Due  . HIV Screening  Never done  . TETANUS/TDAP  Never done     There are no preventive care reminders to display for this patient.  Lab Results  Component Value Date   TSH 0.507 11/30/2019   Lab Results  Component Value Date   WBC 14.3 (H) 11/24/2019   HGB 16.3 11/24/2019   HCT 45.9 11/24/2019   MCV 81.4 11/24/2019   PLT 333 11/24/2019   Lab Results  Component Value Date   NA 135 11/24/2019   K 3.0 (L) 11/24/2019   CO2 25 11/24/2019   GLUCOSE 118 (H) 11/24/2019   BUN 24 (H) 11/24/2019   CREATININE 2.63 (H) 11/24/2019   BILITOT 0.5 11/24/2019   ALKPHOS 102 11/24/2019   AST  21 11/24/2019   ALT 17 11/24/2019   PROT 6.9 11/24/2019   ALBUMIN 3.5 11/24/2019   CALCIUM 9.6 11/24/2019   ANIONGAP 12 11/24/2019   Lab Results  Component Value Date   CHOL 227 (H) 11/30/2019   Lab Results  Component Value Date   HDL 42 11/30/2019   Lab Results  Component Value Date   LDLCALC 158 (H) 11/30/2019   Lab Results  Component Value Date   TRIG 149 11/30/2019   Lab Results  Component Value Date   CHOLHDL 5.4 (H) 11/30/2019   Lab Results  Component Value Date   HGBA1C 6.3 (A) 11/30/2019    Assessment & Plan:   1. Hypertension, unspecified type The current medical regimen is effective; blood pressure is stable at 128/90 today; continue present plan and medications as prescribed. He will continue to take medications as prescribed, to decrease high sodium intake, excessive alcohol intake, increase potassium intake, smoking cessation, and increase physical activity of at least 30 minutes of cardio activity daily. He will continue to follow Heart Healthy or DASH diet.  2. Anxiety We will initiate anxiety medication today.  - busPIRone (BUSPAR) 10 MG tablet; Take 1 tablet (10 mg total) by mouth 2 (two) times daily.  Dispense: 60 tablet; Refill: 1  3. Grief  4. Vitamin D deficiency  5. Nonintractable headache, unspecified chronicity pattern, unspecified headache type - ibuprofen (ADVIL) 600 MG tablet; Take 1 tablet (600 mg total) by  mouth every 8 (eight) hours as needed.  Dispense: 30 tablet; Refill: 2  6. Follow up He will follow up in 6 weeks.   Meds ordered this encounter  Medications  . busPIRone (BUSPAR) 10 MG tablet    Sig: Take 1 tablet (10 mg total) by mouth 2 (two) times daily.    Dispense:  60 tablet    Refill:  1  . ibuprofen (ADVIL) 600 MG tablet    Sig: Take 1 tablet (600 mg total) by mouth every 8 (eight) hours as needed.    Dispense:  30 tablet    Refill:  2    No orders of the defined types were placed in this encounter.   Referral Orders  No referral(s) requested today    Raliegh Ip,  MSN, FNP-BC [facility] [address] [place], [place] [address] [phone] fax   Problem List Items Addressed This Visit      Other   Anxiety   Relevant Medications   busPIRone (BUSPAR) 10 MG tablet   Grief   Vitamin D deficiency    Other Visit Diagnoses    Hypertension, unspecified type    -  Primary   Relevant Medications   cloNIDine (CATAPRES) 0.1 MG tablet   Nonintractable headache, unspecified chronicity pattern, unspecified headache type       Relevant Medications   ibuprofen (ADVIL) 600 MG tablet   Follow up          Meds ordered this encounter  Medications  . busPIRone (BUSPAR) 10 MG tablet    Sig: Take 1 tablet (10 mg total) by mouth 2 (two) times daily.    Dispense:  60 tablet    Refill:  1  . ibuprofen (ADVIL) 600 MG tablet    Sig: Take 1 tablet (600 mg total) by mouth every 8 (eight) hours as needed.    Dispense:  30 tablet    Refill:  2    Follow-up: Return in about  6 weeks (around 02/15/2020).    Azzie Glatter, FNP

## 2020-01-04 NOTE — Patient Instructions (Addendum)
Buspirone tablets What is this medicine? BUSPIRONE (byoo SPYE rone) is used to treat anxiety disorders. This medicine may be used for other purposes; ask your health care provider or pharmacist if you have questions. COMMON BRAND NAME(S): BuSpar What should I tell my health care provider before I take this medicine? They need to know if you have any of these conditions:  kidney or liver disease  an unusual or allergic reaction to buspirone, other medicines, foods, dyes, or preservatives  pregnant or trying to get pregnant  breast-feeding How should I use this medicine? Take this medicine by mouth with a glass of water. Follow the directions on the prescription label. You may take this medicine with or without food. To ensure that this medicine always works the same way for you, you should take it either always with or always without food. Take your doses at regular intervals. Do not take your medicine more often than directed. Do not stop taking except on the advice of your doctor or health care professional. Talk to your pediatrician regarding the use of this medicine in children. Special care may be needed. Overdosage: If you think you have taken too much of this medicine contact a poison control center or emergency room at once. NOTE: This medicine is only for you. Do not share this medicine with others. What if I miss a dose? If you miss a dose, take it as soon as you can. If it is almost time for your next dose, take only that dose. Do not take double or extra doses. What may interact with this medicine? Do not take this medicine with any of the following medications:  linezolid  MAOIs like Carbex, Eldepryl, Marplan, Nardil, and Parnate  methylene blue  procarbazine This medicine may also interact with the following medications:  diazepam  digoxin  diltiazem  erythromycin  grapefruit juice  haloperidol  medicines for mental depression or mood problems  medicines  for seizures like carbamazepine, phenobarbital and phenytoin  nefazodone  other medications for anxiety  rifampin  ritonavir  some antifungal medicines like itraconazole, ketoconazole, and voriconazole  verapamil  warfarin This list may not describe all possible interactions. Give your health care provider a list of all the medicines, herbs, non-prescription drugs, or dietary supplements you use. Also tell them if you smoke, drink alcohol, or use illegal drugs. Some items may interact with your medicine. What should I watch for while using this medicine? Visit your doctor or health care professional for regular checks on your progress. It may take 1 to 2 weeks before your anxiety gets better. You may get drowsy or dizzy. Do not drive, use machinery, or do anything that needs mental alertness until you know how this drug affects you. Do not stand or sit up quickly, especially if you are an older patient. This reduces the risk of dizzy or fainting spells. Alcohol can make you more drowsy and dizzy. Avoid alcoholic drinks. What side effects may I notice from receiving this medicine? Side effects that you should report to your doctor or health care professional as soon as possible:  blurred vision or other vision changes  chest pain  confusion  difficulty breathing  feelings of hostility or anger  muscle aches and pains  numbness or tingling in hands or feet  ringing in the ears  skin rash and itching  vomiting  weakness Side effects that usually do not require medical attention (report to your doctor or health care professional if they continue or   are bothersome):  disturbed dreams, nightmares  headache  nausea  restlessness or nervousness  sore throat and nasal congestion  stomach upset This list may not describe all possible side effects. Call your doctor for medical advice about side effects. You may report side effects to FDA at 1-800-FDA-1088. Where should I  keep my medicine? Keep out of the reach of children. Store at room temperature below 30 degrees C (86 degrees F). Protect from light. Keep container tightly closed. Throw away any unused medicine after the expiration date. NOTE: This sheet is a summary. It may not cover all possible information. If you have questions about this medicine, talk to your doctor, pharmacist, or health care provider.  2020 Elsevier/Gold Standard (2010-03-30 18:06:11) Generalized Anxiety Disorder, Adult Generalized anxiety disorder (GAD) is a mental health disorder. People with this condition constantly worry about everyday events. Unlike normal anxiety, worry related to GAD is not triggered by a specific event. These worries also do not fade or get better with time. GAD interferes with life functions, including relationships, work, and school. GAD can vary from mild to severe. People with severe GAD can have intense waves of anxiety with physical symptoms (panic attacks). What are the causes? The exact cause of GAD is not known. What increases the risk? This condition is more likely to develop in:  Women.  People who have a family history of anxiety disorders.  People who are very shy.  People who experience very stressful life events, such as the death of a loved one.  People who have a very stressful family environment. What are the signs or symptoms? People with GAD often worry excessively about many things in their lives, such as their health and family. They may also be overly concerned about:  Doing well at work.  Being on time.  Natural disasters.  Friendships. Physical symptoms of GAD include:  Fatigue.  Muscle tension or having muscle twitches.  Trembling or feeling shaky.  Being easily startled.  Feeling like your heart is pounding or racing.  Feeling out of breath or like you cannot take a deep breath.  Having trouble falling asleep or staying asleep.  Sweating.  Nausea,  diarrhea, or irritable bowel syndrome (IBS).  Headaches.  Trouble concentrating or remembering facts.  Restlessness.  Irritability. How is this diagnosed? Your health care provider can diagnose GAD based on your symptoms and medical history. You will also have a physical exam. The health care provider will ask specific questions about your symptoms, including how severe they are, when they started, and if they come and go. Your health care provider may ask you about your use of alcohol or drugs, including prescription medicines. Your health care provider may refer you to a mental health specialist for further evaluation. Your health care provider will do a thorough examination and may perform additional tests to rule out other possible causes of your symptoms. To be diagnosed with GAD, a person must have anxiety that:  Is out of his or her control.  Affects several different aspects of his or her life, such as work and relationships.  Causes distress that makes him or her unable to take part in normal activities.  Includes at least three physical symptoms of GAD, such as restlessness, fatigue, trouble concentrating, irritability, muscle tension, or sleep problems. Before your health care provider can confirm a diagnosis of GAD, these symptoms must be present more days than they are not, and they must last for six months or longer. How   is this treated? The following therapies are usually used to treat GAD:  Medicine. Antidepressant medicine is usually prescribed for long-term daily control. Antianxiety medicines may be added in severe cases, especially when panic attacks occur.  Talk therapy (psychotherapy). Certain types of talk therapy can be helpful in treating GAD by providing support, education, and guidance. Options include: ? Cognitive behavioral therapy (CBT). People learn coping skills and techniques to ease their anxiety. They learn to identify unrealistic or negative thoughts  and behaviors and to replace them with positive ones. ? Acceptance and commitment therapy (ACT). This treatment teaches people how to be mindful as a way to cope with unwanted thoughts and feelings. ? Biofeedback. This process trains you to manage your body's response (physiological response) through breathing techniques and relaxation methods. You will work with a therapist while machines are used to monitor your physical symptoms.  Stress management techniques. These include yoga, meditation, and exercise. A mental health specialist can help determine which treatment is best for you. Some people see improvement with one type of therapy. However, other people require a combination of therapies. Follow these instructions at home:  Take over-the-counter and prescription medicines only as told by your health care provider.  Try to maintain a normal routine.  Try to anticipate stressful situations and allow extra time to manage them.  Practice any stress management or self-calming techniques as taught by your health care provider.  Do not punish yourself for setbacks or for not making progress.  Try to recognize your accomplishments, even if they are small.  Keep all follow-up visits as told by your health care provider. This is important. Contact a health care provider if:  Your symptoms do not get better.  Your symptoms get worse.  You have signs of depression, such as: ? A persistently sad, cranky, or irritable mood. ? Loss of enjoyment in activities that used to bring you joy. ? Change in weight or eating. ? Changes in sleeping habits. ? Avoiding friends or family members. ? Loss of energy for normal tasks. ? Feelings of guilt or worthlessness. Get help right away if:  You have serious thoughts about hurting yourself or others. If you ever feel like you may hurt yourself or others, or have thoughts about taking your own life, get help right away. You can go to your nearest  emergency department or call:  Your local emergency services (911 in the [place]  A suicide crisis helpline, such as the National Suicide Prevention Lifeline at 571-208-7056. This is open 24 hours a day. Summary  Generalized anxiety disorder (GAD) is a mental health disorder that involves worry that is not triggered by a specific event.  People with GAD often worry excessively about many things in their lives, such as their health and family.  GAD may cause physical symptoms such as restlessness, trouble concentrating, sleep problems, frequent sweating, nausea, diarrhea, headaches, and trembling or muscle twitching.  A mental health specialist can help determine which treatment is best for you. Some people see improvement with one type of therapy. However, other people require a combination of therapies. This information is not intended to replace advice given to you by your health care provider. Make sure you discuss any questions you have with your health care provider. Document Revised: 08/02/2017 Document Reviewed: 07/10/2016 Elsevier Patient Education  2020 [employer].  Migraine Headache A migraine headache is a very strong throbbing pain on one side or both sides of your head. This type of headache can  also cause other symptoms. It can last from 4 hours to 3 days. Talk with your doctor about what things may bring on (trigger) this condition. What are the causes? The exact cause of this condition is not known. This condition may be triggered or caused by:  Drinking alcohol.  Smoking.  Taking medicines, such as: ? Medicine used to treat chest pain (nitroglycerin). ? Birth control pills. ? Estrogen. ? Some blood pressure medicines.  Eating or drinking certain products.  Doing physical activity. Other things that may trigger a migraine headache include:  Having a menstrual period.  Pregnancy.  Hunger.  Stress.  Not getting enough sleep or getting too much  sleep.  Weather changes.  Tiredness (fatigue). What increases the risk?  Being 64-45 years old.  Being male.  Having a family history of migraine headaches.  Being Caucasian.  Having depression or anxiety.  Being very overweight. What are the signs or symptoms?  A throbbing pain. This pain may: ? Happen in any area of the head, such as on one side or both sides. ? Make it hard to do daily activities. ? Get worse with physical activity. ? Get worse around bright lights or loud noises.  Other symptoms may include: ? Feeling sick to your stomach (nauseous). ? Vomiting. ? Dizziness. ? Being sensitive to bright lights, loud noises, or smells.  Before you get a migraine headache, you may get warning signs (an aura). An aura may include: ? Seeing flashing lights or having blind spots. ? Seeing bright spots, halos, or zigzag lines. ? Having tunnel vision or blurred vision. ? Having numbness or a tingling feeling. ? Having trouble talking. ? Having weak muscles.  Some people have symptoms after a migraine headache (postdromal phase), such as: ? Tiredness. ? Trouble thinking (concentrating). How is this treated?  Taking medicines that: ? Relieve pain. ? Relieve the feeling of being sick to your stomach. ? Prevent migraine headaches.  Treatment may also include: ? Having acupuncture. ? Avoiding foods that bring on migraine headaches. ? Learning ways to control your body functions (biofeedback). ? Therapy to help you know and deal with negative thoughts (cognitive behavioral therapy). Follow these instructions at home: Medicines  Take over-the-counter and prescription medicines only as told by your doctor.  Ask your doctor if the medicine prescribed to you: ? Requires you to avoid driving or using heavy machinery. ? Can cause trouble pooping (constipation). You may need to take these steps to prevent or treat trouble pooping:  Drink enough fluid to keep your pee  (urine) pale yellow.  Take over-the-counter or prescription medicines.  Eat foods that are high in fiber. These include beans, whole grains, and fresh fruits and vegetables.  Limit foods that are high in fat and sugar. These include fried or sweet foods. Lifestyle  Do not drink alcohol.  Do not use any products that contain nicotine or tobacco, such as cigarettes, e-cigarettes, and chewing tobacco. If you need help quitting, ask your doctor.  Get at least 8 hours of sleep every night.  Limit and deal with stress. General instructions      Keep a journal to find out what may bring on your migraine headaches. For example, write down: ? What you eat and drink. ? How much sleep you get. ? Any change in what you eat or drink. ? Any change in your medicines.  If you have a migraine headache: ? Avoid things that make your symptoms worse, such as bright lights. ?  It may help to lie down in a dark, quiet room. ? Do not drive or use heavy machinery. ? Ask your doctor what activities are safe for you.  Keep all follow-up visits as told by your doctor. This is important. Contact a doctor if:  You get a migraine headache that is different or worse than others you have had.  You have more than 15 headache days in one month. Get help right away if:  Your migraine headache gets very bad.  Your migraine headache lasts longer than 72 hours.  You have a fever.  You have a stiff neck.  You have trouble seeing.  Your muscles feel weak or like you cannot control them.  You start to lose your balance a lot.  You start to have trouble walking.  You pass out (faint).  You have a seizure. Summary  A migraine headache is a very strong throbbing pain on one side or both sides of your head. These headaches can also cause other symptoms.  This condition may be treated with medicines and changes to your lifestyle.  Keep a journal to find out what may bring on your migraine  headaches.  Contact a doctor if you get a migraine headache that is different or worse than others you have had.  Contact your doctor if you have more than 15 headache days in a month. This information is not intended to replace advice given to you by your health care provider. Make sure you discuss any questions you have with your health care provider. Document Revised: 12/12/2018 Document Reviewed: 10/02/2018 Elsevier Patient Education  2020 [employer] Inc[employer] Ibuprofen tablets and capsules What is this medicine? IBUPROFEN (eye BYOO proe fen) is a non-steroidal anti-inflammatory drug (NSAID). It is used for dental pain, fever, headaches or migraines, osteoarthritis, rheumatoid arthritis, or painful monthly periods. It can also relieve minor aches and pains caused by a cold, flu, or sore throat. This medicine may be used for other purposes; ask your health care provider or pharmacist if you have questions. COMMON BRAND NAME(S): Advil, Advil Junior Strength, Advil Migraine, Genpril, Ibren, IBU, Ibupak, Midol, Midol Cramps and Body Aches, Motrin, Motrin IB, Motrin Junior Strength, Motrin Migraine Pain, Samson-8, Toxicology Saliva Collection What should I tell my health care provider before I take this medicine? They need to know if you have any of these conditions:  cigarette smoker  coronary artery bypass graft (CABG) surgery within the past 2 weeks  drink more than 3 alcohol-containing drinks a day  heart disease  high blood pressure  history of stomach bleeding  kidney disease  liver disease  lung or breathing disease, like asthma  an unusual or allergic reaction to ibuprofen, aspirin, other NSAIDs, other medicines, foods, dyes, or preservatives  pregnant or trying to get pregnant  breast-feeding How should I use this medicine? Take this medicine by mouth with a glass of water. Follow the directions on the prescription label. Take this medicine with food if your stomach gets  upset. Try to not lie down for at least 10 minutes after you take the medicine. Take your medicine at regular intervals. Do not take your medicine more often than directed. A special MedGuide will be given to you by the pharmacist with each prescription and refill. Be sure to read this information carefully each time. Talk to your pediatrician regarding the use of this medicine in children. Special care may be needed. Overdosage: If you think you have taken too much of this medicine contact  a poison control center or emergency room at once. NOTE: This medicine is only for you. Do not share this medicine with others. What if I miss a dose? If you miss a dose, take it as soon as you can. If it is almost time for your next dose, take only that dose. Do not take double or extra doses. What may interact with this medicine? Do not take this medicine with any of the following medications:  cidofovir  ketorolac  methotrexate  pemetrexed This medicine may also interact with the following medications:  alcohol  aspirin  diuretics  lithium  other drugs for inflammation like prednisone  warfarin This list may not describe all possible interactions. Give your health care provider a list of all the medicines, herbs, non-prescription drugs, or dietary supplements you use. Also tell them if you smoke, drink alcohol, or use illegal drugs. Some items may interact with your medicine. What should I watch for while using this medicine? Tell your doctor or healthcare provider if your symptoms do not start to get better or if they get worse. This medicine may cause serious skin reactions. They can happen weeks to months after starting the medicine. Contact your healthcare provider right away if you notice fevers or flu-like symptoms with a rash. The rash may be red or purple and then turn into blisters or peeling of the skin. Or, you might notice a red rash with swelling of the face, lips or lymph nodes in  your neck or under your arms. This medicine does not prevent heart attack or stroke. In fact, this medicine may increase the chance of a heart attack or stroke. The chance may increase with longer use of this medicine and in people who have heart disease. If you take aspirin to prevent heart attack or stroke, talk with your doctor or healthcare provider. Do not take other medicines that contain aspirin, ibuprofen, or naproxen with this medicine. Side effects such as stomach upset, nausea, or ulcers may be more likely to occur. Many medicines available without a prescription should not be taken with this medicine. This medicine can cause ulcers and bleeding in the stomach and intestines at any time during treatment. Ulcers and bleeding can happen without warning symptoms and can cause death. To reduce your risk, do not smoke cigarettes or drink alcohol while you are taking this medicine. You may get drowsy or dizzy. Do not drive, use machinery, or do anything that needs mental alertness until you know how this medicine affects you. Do not stand or sit up quickly, especially if you are an older patient. This reduces the risk of dizzy or fainting spells. This medicine can cause you to bleed more easily. Try to avoid damage to your teeth and gums when you brush or floss your teeth. This medicine may be used to treat migraines. If you take migraine medicines for 10 or more days a month, your migraines may get worse. Keep a diary of headache days and medicine use. Contact your healthcare provider if your migraine attacks occur more frequently. What side effects may I notice from receiving this medicine? Side effects that you should report to your doctor or health care professional as soon as possible:  allergic reactions like skin rash, itching or hives, swelling of the face, lips, or tongue  redness, blistering, peeling or loosening of the skin, including inside the mouth  severe stomach pain  signs and  symptoms of bleeding such as bloody or black, tarry stools; red  or dark-brown urine; spitting up blood or brown material that looks like coffee grounds; red spots on the skin; unusual bruising or bleeding from the eye, gums, or nose  signs and symptoms of a blood clot such as changes in vision; chest pain; severe, sudden headache; trouble speaking; sudden numbness or weakness of the face, arm, or leg  unexplained weight gain or swelling  unusually weak or tired  yellowing of eyes or skin Side effects that usually do not require medical attention (report to your doctor or health care professional if they continue or are bothersome):  bruising  diarrhea  dizziness, drowsiness  headache  nausea, vomiting This list may not describe all possible side effects. Call your doctor for medical advice about side effects. You may report side effects to FDA at 1-800-FDA-1088. Where should I keep my medicine? Keep out of the reach of children. Store at room temperature between 15 and 30 degrees C (59 and 86 degrees F). Keep container tightly closed. Throw away any unused medicine after the expiration date. NOTE: This sheet is a summary. It may not cover all possible information. If you have questions about this medicine, talk to your doctor, pharmacist, or health care provider.  2020 Elsevier/Gold Standard (2018-11-05 14:11:00)

## 2020-01-05 ENCOUNTER — Encounter: Payer: Self-pay | Admitting: Family Medicine

## 2020-01-05 DIAGNOSIS — E559 Vitamin D deficiency, unspecified: Secondary | ICD-10-CM | POA: Insufficient documentation

## 2020-01-05 DIAGNOSIS — F4321 Adjustment disorder with depressed mood: Secondary | ICD-10-CM | POA: Insufficient documentation

## 2020-01-05 DIAGNOSIS — F419 Anxiety disorder, unspecified: Secondary | ICD-10-CM | POA: Insufficient documentation

## 2020-01-28 ENCOUNTER — Other Ambulatory Visit: Payer: Self-pay | Admitting: Family Medicine

## 2020-01-28 DIAGNOSIS — F419 Anxiety disorder, unspecified: Secondary | ICD-10-CM

## 2020-01-28 NOTE — Telephone Encounter (Signed)
Refill requsest Buspar

## 2020-02-04 ENCOUNTER — Other Ambulatory Visit: Payer: Self-pay | Admitting: Family Medicine

## 2020-02-15 ENCOUNTER — Other Ambulatory Visit: Payer: Self-pay

## 2020-02-15 ENCOUNTER — Ambulatory Visit (INDEPENDENT_AMBULATORY_CARE_PROVIDER_SITE_OTHER): Payer: 59 | Admitting: Family Medicine

## 2020-02-15 ENCOUNTER — Encounter: Payer: Self-pay | Admitting: Family Medicine

## 2020-02-15 VITALS — BP 128/76 | HR 64 | Temp 97.9°F | Ht 69.0 in | Wt 173.0 lb

## 2020-02-15 DIAGNOSIS — F419 Anxiety disorder, unspecified: Secondary | ICD-10-CM | POA: Diagnosis not present

## 2020-02-15 DIAGNOSIS — Z09 Encounter for follow-up examination after completed treatment for conditions other than malignant neoplasm: Secondary | ICD-10-CM

## 2020-02-15 DIAGNOSIS — R253 Fasciculation: Secondary | ICD-10-CM | POA: Diagnosis not present

## 2020-02-15 DIAGNOSIS — I10 Essential (primary) hypertension: Secondary | ICD-10-CM | POA: Diagnosis not present

## 2020-02-15 DIAGNOSIS — E876 Hypokalemia: Secondary | ICD-10-CM

## 2020-02-15 MED ORDER — POTASSIUM CHLORIDE CRYS ER 20 MEQ PO TBCR: 20.0000 meq | EXTENDED_RELEASE_TABLET | Freq: Every day | ORAL | 3 refills | Status: DC

## 2020-02-15 NOTE — Progress Notes (Signed)
[facility]   Established Patient Office Visit  Subjective:  Patient ID: Jeremiah [name], male    DOB: 05/28/1981  Age: 39 y.o. MRN: [id number]  CC:  Chief Complaint  Patient presents with  . Follow-up    tingling in left side face     HPI [name] is a 39 year old male who presents for Follow Up today.   Past Medical History:  Diagnosis Date  . Anxiety 01/04/2020  . Elevated lipids 11/2019  . Elevated serum creatinine 11/2019  . GERD (gastroesophageal reflux disease)   . Hypertension   . Hypertensive crisis 11/2019  . Hypokalemia 11/2019  . Vitamin D deficiency 11/2019    Patient Active Problem List   Diagnosis Date Noted  . Anxiety 01/05/2020  . Grief 01/05/2020  . Vitamin D deficiency 01/05/2020  . Hypertensive crisis 11/30/2019  . Hyperglycemia 11/30/2019  . Prediabetes 11/30/2019  . Chronic nonintractable headache 11/30/2019    Current Status: Since his last office visit, he is doing well with no complaints. He has has a history of muscle tingling/discomfort over the past months, but has recently notice intermittent facial muscle 'twitching.' He denies pain. He denies fevers, chills, fatigue, recent infections, weight loss, and night sweats. H has not had any headaches, visual changes, dizziness, and falls. No chest pain, heart palpitations, cough and shortness of breath reported. Denies GI problems such as nausea, vomiting, diarrhea, and constipation. He has no reports of blood in stools, dysuria and hematuria. No depression or anxiety reported today. He denies suicidal ideations, homicidal ideations, or auditory hallucinations. He is taking all medications as prescribed. He denies pain today.   History reviewed. No pertinent surgical history.  Family History  Problem Relation Age of Onset  . Hypertension Father     Social History   Socioeconomic History  . Marital status: Significant Other    Spouse name:  Not on file  . Number of children: Not on file  . Years of education: Not on file  . Highest education level: Not on file  Occupational History  . Not on file  Tobacco Use  . Smoking status: Never Smoker  . Smokeless tobacco: Never Used  Vaping Use  . Vaping Use: Never used  Substance and Sexual Activity  . Alcohol use: Yes  . Drug use: No  . Sexual activity: Yes    Partners: Male  Other Topics Concern  . Not on file  Social History Narrative  . Not on file   Social Determinants of Health   Financial Resource Strain:   . Difficulty of Paying Living Expenses:   Food Insecurity:   . Worried About Programme researcher, broadcasting/film/video in the Last Year:   . Barista in the Last Year:   Transportation Needs:   . Freight forwarder (Medical):   Marland Kitchen Lack of Transportation (Non-Medical):   Physical Activity:   . Days of Exercise per Week:   . Minutes of Exercise per Session:   Stress:   . Feeling of Stress :   Social Connections:   . Frequency of Communication with Friends and Family:   . Frequency of Social Gatherings with Friends and Family:   . Attends Religious Services:   . Active Member of Clubs or Organizations:   . Attends Banker Meetings:   Marland Kitchen Marital Status:   Intimate Partner Violence:   . Fear of Current or Ex-Partner:   . Emotionally Abused:   .  Physically Abused:   . Sexually Abused:     Outpatient Medications Prior to Visit  Medication Sig Dispense Refill  . amLODipine (NORVASC) 5 MG tablet TAKE 1 TABLET BY MOUTH EVERY DAY 30 tablet 1  . busPIRone (BUSPAR) 10 MG tablet TAKE 1 TABLET BY MOUTH TWICE A DAY 180 tablet 1  . carvedilol (COREG) 6.25 MG tablet TAKE 1 TABLET (6.25 MG TOTAL) BY MOUTH 2 (TWO) TIMES DAILY WITH A MEAL. 60 tablet 0  . cloNIDine (CATAPRES) 0.1 MG tablet Take 0.1 mg by mouth 3 (three) times daily.    . diphenhydrAMINE (BENADRYL) 25 MG tablet Take 25 mg by mouth every 8 (eight) hours as needed for itching or allergies.     Marland Kitchen  lisinopril (ZESTRIL) 10 MG tablet Take 1 tablet (10 mg total) by mouth daily. 30 tablet 3  . omeprazole (PRILOSEC) 20 MG capsule Take 20 mg by mouth daily as needed (acid reflux).     . Vitamin D, Ergocalciferol, (DRISDOL) 1.25 MG (50000 UNIT) CAPS capsule Take 1 capsule (50,000 Units total) by mouth every 7 (seven) days. 5 capsule 6  . ibuprofen (ADVIL) 600 MG tablet Take 1 tablet (600 mg total) by mouth every 8 (eight) hours as needed. (Patient not taking: Reported on 02/15/2020) 30 tablet 2  . amLODipine (NORVASC) 10 MG tablet Take 1 tablet (10 mg total) by mouth daily. 30 tablet 0   No facility-administered medications prior to visit.    No Known Allergies  ROS Review of Systems  Constitutional: Negative.   HENT: Negative.   Eyes: Negative.   Respiratory: Negative.   Cardiovascular: Negative.   Gastrointestinal: Negative.   Endocrine: Negative.   Genitourinary: Negative.   Musculoskeletal: Negative.        Muscle 'twitches' in extremities  Skin: Negative.   Allergic/Immunologic: Negative.   Neurological: Positive for dizziness (occasional ) and headaches (occasional ).  Hematological: Negative.   Psychiatric/Behavioral: Negative.       Objective:    Physical Exam Vitals and nursing note reviewed.  Constitutional:      Appearance: Normal appearance. He is normal weight.  HENT:     Head: Normocephalic and atraumatic.     Nose: Nose normal.     Mouth/Throat:     Mouth: Mucous membranes are moist.  Cardiovascular:     Rate and Rhythm: Normal rate and regular rhythm.     Pulses: Normal pulses.     Heart sounds: Normal heart sounds.  Pulmonary:     Effort: Pulmonary effort is normal.     Breath sounds: Normal breath sounds.  Abdominal:     General: Abdomen is flat. Bowel sounds are normal.     Palpations: Abdomen is soft.  Musculoskeletal:        General: Normal range of motion.     Cervical back: Normal range of motion and neck supple.  Skin:    General: Skin is  warm and dry.  Neurological:     General: No focal deficit present.     Mental Status: He is alert and oriented to person, place, and time.  Psychiatric:        Mood and Affect: Mood normal.        Behavior: Behavior normal.        Thought Content: Thought content normal.        Judgment: Judgment normal.     BP 128/76 (BP Location: Left Arm, Patient Position: Sitting)   Pulse 64   Temp 97.9 F (36.6  C) (Temporal)   Ht 5\' 9"  (1.753 m)   Wt 173 lb (78.5 kg)   SpO2 100%   BMI 25.55 kg/m  Wt Readings from Last 3 Encounters:  02/15/20 173 lb (78.5 kg)  01/04/20 170 lb 6.4 oz (77.3 kg)  11/30/19 166 lb 12.8 oz (75.7 kg)     Health Maintenance Due  Topic Date Due  . Hepatitis C Screening  Never done  . COVID-19 Vaccine (1) Never done  . HIV Screening  Never done  . TETANUS/TDAP  Never done    There are no preventive care reminders to display for this patient.  Lab Results  Component Value Date   TSH 0.507 11/30/2019   Lab Results  Component Value Date   WBC 14.3 (H) 11/24/2019   HGB 16.3 11/24/2019   HCT 45.9 11/24/2019   MCV 81.4 11/24/2019   PLT 333 11/24/2019   Lab Results  Component Value Date   NA 135 11/24/2019   K 3.0 (L) 11/24/2019   CO2 25 11/24/2019   GLUCOSE 118 (H) 11/24/2019   BUN 24 (H) 11/24/2019   CREATININE 2.63 (H) 11/24/2019   BILITOT 0.5 11/24/2019   ALKPHOS 102 11/24/2019   AST 21 11/24/2019   ALT 17 11/24/2019   PROT 6.9 11/24/2019   ALBUMIN 3.5 11/24/2019   CALCIUM 9.6 11/24/2019   ANIONGAP 12 11/24/2019   Lab Results  Component Value Date   CHOL 227 (H) 11/30/2019   Lab Results  Component Value Date   HDL 42 11/30/2019   Lab Results  Component Value Date   LDLCALC 158 (H) 11/30/2019   Lab Results  Component Value Date   TRIG 149 11/30/2019   Lab Results  Component Value Date   CHOLHDL 5.4 (H) 11/30/2019   Lab Results  Component Value Date   HGBA1C 6.3 (A) 11/30/2019      Assessment & Plan:   1. Muscle  twitching He has history of Hypokalemia. We will assess K+ and Vitamin levels today.   2. Hypokalemia - potassium chloride SA (KLOR-CON) 20 MEQ tablet; Take 1 tablet (20 mEq total) by mouth daily.  Dispense: 30 tablet; Refill: 3 - Potassium - Vitamin B12  3. Hypertension, unspecified type The current medical regimen is effective; blood pressure is stable at 128/76 today; continue present plan and medications as prescribed. He will continue to take medications as prescribed, to decrease high sodium intake, excessive alcohol intake, increase potassium intake, smoking cessation, and increase physical activity of at least 30 minutes of cardio activity daily. He will continue to follow Heart Healthy or DASH diet.  4. Anxiety Stable today.   5. Follow up He will follow up in 6 months.   Meds ordered this encounter  Medications  . potassium chloride SA (KLOR-CON) 20 MEQ tablet    Sig: Take 1 tablet (20 mEq total) by mouth daily.    Dispense:  30 tablet    Refill:  3    Orders Placed This Encounter  Procedures  . Potassium  . Vitamin B12    Referral Orders  No referral(s) requested today    12/02/2019,  MSN, FNP-BC [facility] [address] [place], [place] [phone] fax   Problem List Items Addressed This Visit      Other   Anxiety    Other Visit Diagnoses    Muscle twitching    -  Primary   Hypokalemia  Relevant Medications   potassium chloride SA (KLOR-CON) 20 MEQ tablet   Other Relevant Orders   Potassium   Vitamin B12   Hypertension, unspecified type       Follow up          Meds ordered this encounter  Medications  . potassium chloride SA (KLOR-CON) 20 MEQ tablet    Sig: Take 1 tablet (20 mEq total) by mouth daily.    Dispense:  30 tablet    Refill:  3    Follow-up: Return in about 6 months (around 08/16/2020).    Kallie Locks, FNP

## 2020-02-16 LAB — VITAMIN B12: Vitamin B-12: 808 pg/mL (ref 232–1245)

## 2020-02-16 LAB — POTASSIUM: Potassium: 4.2 mmol/L (ref 3.5–5.2)

## 2020-02-17 ENCOUNTER — Encounter: Payer: Self-pay | Admitting: Family Medicine

## 2020-02-17 DIAGNOSIS — R253 Fasciculation: Secondary | ICD-10-CM | POA: Insufficient documentation

## 2020-02-17 DIAGNOSIS — E876 Hypokalemia: Secondary | ICD-10-CM | POA: Insufficient documentation

## 2020-03-16 ENCOUNTER — Other Ambulatory Visit: Payer: Self-pay | Admitting: Family Medicine

## 2020-04-01 ENCOUNTER — Other Ambulatory Visit: Payer: Self-pay | Admitting: Family Medicine

## 2020-05-02 ENCOUNTER — Other Ambulatory Visit: Payer: Self-pay | Admitting: Family Medicine

## 2020-05-02 ENCOUNTER — Other Ambulatory Visit: Payer: Self-pay | Admitting: Nurse Practitioner

## 2020-05-02 DIAGNOSIS — R519 Headache, unspecified: Secondary | ICD-10-CM

## 2020-05-02 DIAGNOSIS — E559 Vitamin D deficiency, unspecified: Secondary | ICD-10-CM

## 2020-05-02 DIAGNOSIS — E876 Hypokalemia: Secondary | ICD-10-CM

## 2020-05-02 NOTE — Telephone Encounter (Signed)
Last few appointments patient were seen by you

## 2020-06-21 ENCOUNTER — Other Ambulatory Visit: Payer: Self-pay | Admitting: Family Medicine

## 2020-06-21 DIAGNOSIS — I10 Essential (primary) hypertension: Secondary | ICD-10-CM

## 2020-06-21 MED ORDER — CARVEDILOL 6.25 MG PO TABS: 6.2500 mg | ORAL_TABLET | Freq: Two times a day (BID) | ORAL | 3 refills | Status: DC

## 2020-07-11 ENCOUNTER — Other Ambulatory Visit: Payer: Self-pay | Admitting: Nurse Practitioner

## 2020-07-11 DIAGNOSIS — I10 Essential (primary) hypertension: Secondary | ICD-10-CM

## 2020-07-11 DIAGNOSIS — I169 Hypertensive crisis, unspecified: Secondary | ICD-10-CM

## 2020-07-11 IMAGING — DX DG CHEST 1V PORT
1 series · 1 of 1 positions shown · non-contrast
Comparison: None.

CLINICAL DATA: Cough.  BQT0M-A5 positive

EXAM:
PORTABLE CHEST 1 VIEW

[chest ap]
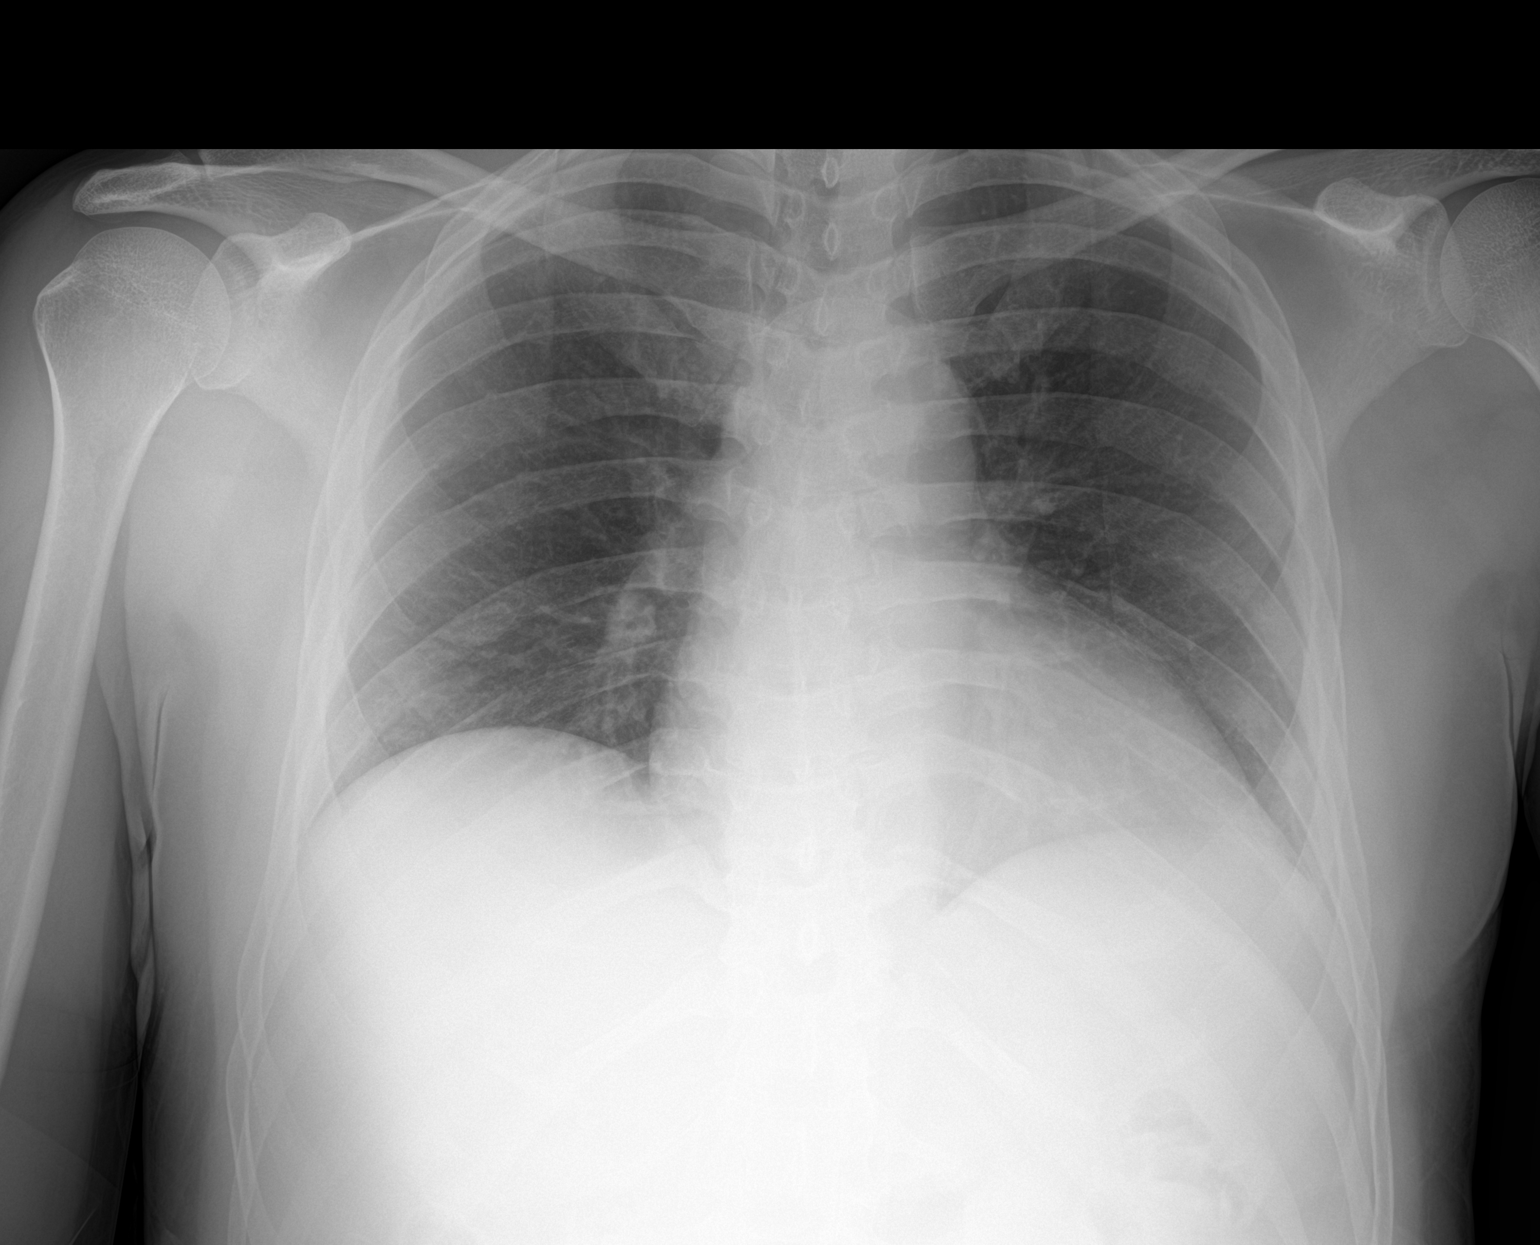

[1 of 1 positions shown; findings below may reference images not displayed]

FINDINGS: Lungs are clear. Heart is upper normal in size with pulmonary
vascularity normal. No adenopathy. No bone lesions.
IMPRESSION: No edema or consolidation. Heart upper normal in size. No adenopathy
evident.

## 2020-07-11 NOTE — Telephone Encounter (Signed)
Please see medication request.

## 2020-08-15 ENCOUNTER — Other Ambulatory Visit: Payer: Self-pay | Admitting: Nurse Practitioner

## 2020-08-16 NOTE — Telephone Encounter (Signed)
Please see patient refill request.

## 2020-08-17 ENCOUNTER — Encounter: Payer: Self-pay | Admitting: Nurse Practitioner

## 2020-08-17 ENCOUNTER — Other Ambulatory Visit: Payer: Self-pay

## 2020-08-17 ENCOUNTER — Ambulatory Visit (INDEPENDENT_AMBULATORY_CARE_PROVIDER_SITE_OTHER): Payer: 59 | Admitting: Nurse Practitioner

## 2020-08-17 VITALS — BP 158/93 | HR 65 | Temp 97.6°F | Ht 69.0 in | Wt 179.0 lb

## 2020-08-17 DIAGNOSIS — E876 Hypokalemia: Secondary | ICD-10-CM

## 2020-08-17 DIAGNOSIS — I10 Essential (primary) hypertension: Secondary | ICD-10-CM

## 2020-08-17 DIAGNOSIS — Z1159 Encounter for screening for other viral diseases: Secondary | ICD-10-CM | POA: Diagnosis not present

## 2020-08-17 DIAGNOSIS — R062 Wheezing: Secondary | ICD-10-CM | POA: Diagnosis not present

## 2020-08-17 DIAGNOSIS — Z114 Encounter for screening for human immunodeficiency virus [HIV]: Secondary | ICD-10-CM

## 2020-08-17 DIAGNOSIS — Z9189 Other specified personal risk factors, not elsewhere classified: Secondary | ICD-10-CM

## 2020-08-17 DIAGNOSIS — R7303 Prediabetes: Secondary | ICD-10-CM

## 2020-08-17 MED ORDER — AMLODIPINE BESYLATE 5 MG PO TABS: 5.0000 mg | ORAL_TABLET | Freq: Every day | ORAL | 3 refills | Status: DC

## 2020-08-17 MED ORDER — ALBUTEROL SULFATE HFA 108 (90 BASE) MCG/ACT IN AERS: 1.0000 | INHALATION_SPRAY | Freq: Four times a day (QID) | RESPIRATORY_TRACT | 2 refills | Status: DC | PRN

## 2020-08-17 MED ORDER — OMEPRAZOLE 20 MG PO CPDR: 20.0000 mg | DELAYED_RELEASE_CAPSULE | Freq: Every day | ORAL | 3 refills | Status: DC | PRN

## 2020-08-17 NOTE — Patient Instructions (Signed)
   Managing Your Hypertension Hypertension is commonly called high blood pressure. This is when the force of your blood pressing against the walls of your arteries is too strong. Arteries are blood vessels that carry blood from your heart throughout your body. Hypertension forces the heart to work harder to pump blood, and may cause the arteries to become narrow or stiff. Having untreated or uncontrolled hypertension can cause heart attack, stroke, kidney disease, and other problems. What are blood pressure readings? A blood pressure reading consists of a higher number over a lower number. Ideally, your blood pressure should be below 120/80. The first ("top") number is called the systolic pressure. It is a measure of the pressure in your arteries as your heart beats. The second ("bottom") number is called the diastolic pressure. It is a measure of the pressure in your arteries as the heart relaxes. What does my blood pressure reading mean? Blood pressure is classified into four stages. Based on your blood pressure reading, your health care provider may use the following stages to determine what type of treatment you need, if any. Systolic pressure and diastolic pressure are measured in a unit called mm Hg. Normal  Systolic pressure: below 120.  Diastolic pressure: below 80. Elevated  Systolic pressure: 120-129.  Diastolic pressure: below 80. Hypertension stage 1  Systolic pressure: 130-139.  Diastolic pressure: 80-89. Hypertension stage 2  Systolic pressure: 140 or above.  Diastolic pressure: 90 or above. What health risks are associated with hypertension? Managing your hypertension is an important responsibility. Uncontrolled hypertension can lead to:  A heart attack.  A stroke.  A weakened blood vessel (aneurysm).  Heart failure.  Kidney damage.  Eye damage.  Metabolic syndrome.  Memory and concentration problems. What changes can I make to manage my  hypertension? Hypertension can be managed by making lifestyle changes and possibly by taking medicines. Your health care provider will help you make a plan to bring your blood pressure within a normal range. Eating and drinking   Eat a diet that is high in fiber and potassium, and low in salt (sodium), added sugar, and fat. An example eating plan is called the DASH (Dietary Approaches to Stop Hypertension) diet. To eat this way: ? Eat plenty of fresh fruits and vegetables. Try to fill half of your plate at each meal with fruits and vegetables. ? Eat whole grains, such as whole wheat pasta, brown rice, or whole grain bread. Fill about one quarter of your plate with whole grains. ? Eat low-fat diary products. ? Avoid fatty cuts of meat, processed or cured meats, and poultry with skin. Fill about one quarter of your plate with lean proteins such as fish, chicken without skin, beans, eggs, and tofu. ? Avoid premade and processed foods. These tend to be higher in sodium, added sugar, and fat.  Reduce your daily sodium intake. Most people with hypertension should eat less than 1,500 mg of sodium a day.  Limit alcohol intake to no more than 1 drink a day for nonpregnant women and 2 drinks a day for men. One drink equals 12 oz of beer, 5 oz of wine, or 1 oz of hard liquor. Lifestyle  Work with your health care provider to maintain a healthy body weight, or to lose weight. Ask what an ideal weight is for you.  Get at least 30 minutes of exercise that causes your heart to beat faster (aerobic exercise) most days of the week. Activities may include walking, swimming, or biking.    Include exercise to strengthen your muscles (resistance exercise), such as weight lifting, as part of your weekly exercise routine. Try to do these types of exercises for 30 minutes at least 3 days a week.  Do not use any products that contain nicotine or tobacco, such as cigarettes and e-cigarettes. If you need help quitting,  ask your health care provider.  Control any long-term (chronic) conditions you have, such as high cholesterol or diabetes. Monitoring  Monitor your blood pressure at home as told by your health care provider. Your personal target blood pressure may vary depending on your medical conditions, your age, and other factors.  Have your blood pressure checked regularly, as often as told by your health care provider. Working with your health care provider  Review all the medicines you take with your health care provider because there may be side effects or interactions.  Talk with your health care provider about your diet, exercise habits, and other lifestyle factors that may be contributing to hypertension.  Visit your health care provider regularly. Your health care provider can help you create and adjust your plan for managing hypertension. Will I need medicine to control my blood pressure? Your health care provider may prescribe medicine if lifestyle changes are not enough to get your blood pressure under control, and if:  Your systolic blood pressure is 130 or higher.  Your diastolic blood pressure is 80 or higher. Take medicines only as told by your health care provider. Follow the directions carefully. Blood pressure medicines must be taken as prescribed. The medicine does not work as well when you skip doses. Skipping doses also puts you at risk for problems. Contact a health care provider if:  You think you are having a reaction to medicines you have taken.  You have repeated (recurrent) headaches.  You feel dizzy.  You have swelling in your ankles.  You have trouble with your vision. Get help right away if:  You develop a severe headache or confusion.  You have unusual weakness or numbness, or you feel faint.  You have severe pain in your chest or abdomen.  You vomit repeatedly.  You have trouble breathing. Summary  Hypertension is when the force of blood pumping  through your arteries is too strong. If this condition is not controlled, it may put you at risk for serious complications.  Your personal target blood pressure may vary depending on your medical conditions, your age, and other factors. For most people, a normal blood pressure is less than 120/80.  Hypertension is managed by lifestyle changes, medicines, or both. Lifestyle changes include weight loss, eating a healthy, low-sodium diet, exercising more, and limiting alcohol. This information is not intended to replace advice given to you by your health care provider. Make sure you discuss any questions you have with your health care provider. Document Revised: 12/12/2018 Document Reviewed: 07/18/2016 Elsevier Patient Education  2020 [employer]  

## 2020-08-17 NOTE — Progress Notes (Signed)
[facility] [address] [place], [place]  [address] Phone:  [phone]   Fax:  [phone]   Established Patient Office Visit  Subjective:  Patient ID: Jeremiah [name], male    DOB: [birth year]  Age: 39 y.o. MRN: [id number]  CC:  Chief Complaint  Patient presents with  . Wheezing    HPI Jeremiah [name] presents for follow up. He  has a past medical history of Anxiety (01/04/2020), Elevated lipids (11/2019), Elevated serum creatinine (11/2019), GERD (gastroesophageal reflux disease), Hypertension, Hypertensive crisis (11/2019), Hypokalemia (11/2019), and Vitamin D deficiency (11/2019).   Current symptoms include wheezing. Symptoms have been present since yesterday and have been stable. He denies chest pain, chest tightness, dyspnea, productive cough and wheezing. Associated symptoms include none.  This episode appears to have been triggered by has been hanging Christmas decorations in the ceiling and notice duck work falling. Treatments tried for the current exacerbation include albuterol inhaler, which have provided some relief of symptoms. The patient has not has these symptoms in the past.   Hypertension Patient is here for follow-up of elevated blood pressure. He is not exercising and is adherent to a low-salt diet. Cardiac symptoms: none. Patient denies chest pain, exertional chest pressure/discomfort, lower extremity edema and syncope. Cardiovascular risk factors: hypertension and male gender. Use of agents associated with hypertension: NSAIDS. History of target organ damage: chronic kidney disease. His BP is elevated today he has taken his medication. He is aware that compliance is important.  Past Medical History:  Diagnosis Date  . Anxiety 01/04/2020  . Elevated lipids 11/2019  . Elevated serum creatinine 11/2019  . GERD (gastroesophageal reflux disease)   . Hypertension   . Hypertensive crisis 11/2019  . Hypokalemia 11/2019  . Vitamin D deficiency 11/2019     History reviewed. No pertinent surgical history.  Family History  Problem Relation Age of Onset  . Hypertension Father     Social History   Socioeconomic History  . Marital status: Significant Other    Spouse name: Not on file  . Number of children: Not on file  . Years of education: Not on file  . Highest education level: Not on file  Occupational History  . Not on file  Tobacco Use  . Smoking status: Never Smoker  . Smokeless tobacco: Never Used  Vaping Use  . Vaping Use: Never used  Substance and Sexual Activity  . Alcohol use: Yes  . Drug use: No  . Sexual activity: Yes    Partners: Male  Other Topics Concern  . Not on file  Social History Narrative  . Not on file   Social Determinants of Health   Financial Resource Strain: Not on file  Food Insecurity: Not on file  Transportation Needs: Not on file  Physical Activity: Not on file  Stress: Not on file  Social Connections: Not on file  Intimate Partner Violence: Not on file    Outpatient Medications Prior to Visit  Medication Sig Dispense Refill  . busPIRone (BUSPAR) 10 MG tablet TAKE 1 TABLET BY MOUTH TWICE A DAY 180 tablet 1  . carvedilol (COREG) 6.25 MG tablet Take 1 tablet (6.25 mg total) by mouth 2 (two) times daily with a meal. 180 tablet 3  . cloNIDine (CATAPRES) 0.1 MG tablet TAKE 1 TABLET BY MOUTH 3 TIMES DAILY. 90 tablet 3  . diphenhydrAMINE (BENADRYL) 25 MG tablet Take 25 mg by mouth every 8 (eight) hours as needed for itching or allergies.     Marland Kitchen  ibuprofen (ADVIL) 600 MG tablet TAKE 1 TABLET (600 MG TOTAL) BY MOUTH EVERY 8 (EIGHT) HOURS AS NEEDED. 30 tablet 2  . KLOR-CON M20 20 MEQ tablet TAKE 1 TABLET BY MOUTH EVERY DAY 90 tablet 1  . lisinopril (ZESTRIL) 10 MG tablet Take 1 tablet (10 mg total) by mouth daily. 90 tablet 3  . Vitamin D, Ergocalciferol, (DRISDOL) 1.25 MG (50000 UNIT) CAPS capsule TAKE 1 CAPSULE (50,000 UNITS TOTAL) BY MOUTH EVERY 7 (SEVEN) DAYS. 12 capsule 2  . amLODipine  (NORVASC) 5 MG tablet TAKE 1 TABLET BY MOUTH EVERY DAY 30 tablet 3  . omeprazole (PRILOSEC) 20 MG capsule Take 20 mg by mouth daily as needed (acid reflux).      No facility-administered medications prior to visit.    No Known Allergies  ROS Review of Systems    Objective:    Physical Exam HENT:     Head: Normocephalic and atraumatic.     Nose:     Comments: Head congestion noted  Cardiovascular:     Rate and Rhythm: Normal rate and regular rhythm.     Pulses: Normal pulses.     Heart sounds: Normal heart sounds.  Pulmonary:     Effort: Pulmonary effort is normal.     Breath sounds: Normal breath sounds.  Abdominal:     General: Bowel sounds are normal.     Palpations: Abdomen is soft.     Comments: Increased abdominal girth  Musculoskeletal:        General: Normal range of motion.     Cervical back: Normal range of motion.  Skin:    General: Skin is warm and dry.     Capillary Refill: Capillary refill takes less than 2 seconds.  Neurological:     General: No focal deficit present.     Mental Status: He is alert and oriented to person, place, and time.  Psychiatric:        Mood and Affect: Mood normal.        Behavior: Behavior normal.        Thought Content: Thought content normal.        Judgment: Judgment normal.     BP (!) 158/93   Pulse 65   Temp 97.6 F (36.4 C) (Oral)   Ht 5\' 9"  (1.753 m)   Wt 179 lb (81.2 kg)   SpO2 99%   BMI 26.43 kg/m  Wt Readings from Last 3 Encounters:  08/17/20 179 lb (81.2 kg)  02/15/20 173 lb (78.5 kg)  01/04/20 170 lb 6.4 oz (77.3 kg)     Health Maintenance Due  Topic Date Due  . TETANUS/TDAP  Never done  . INFLUENZA VACCINE  Never done    There are no preventive care reminders to display for this patient.  Lab Results  Component Value Date   TSH 0.507 11/30/2019   Lab Results  Component Value Date   WBC 10.5 08/17/2020   HGB 15.1 08/17/2020   HCT 44.5 08/17/2020   MCV 84 08/17/2020   PLT 297 08/17/2020    Lab Results  Component Value Date   NA 139 08/17/2020   K 4.7 08/17/2020   CO2 25 11/24/2019   GLUCOSE 99 08/17/2020   BUN 26 (H) 08/17/2020   CREATININE 2.10 (H) 08/17/2020   BILITOT <0.2 08/17/2020   ALKPHOS 98 08/17/2020   AST 19 08/17/2020   ALT 17 11/24/2019   PROT 7.5 08/17/2020   ALBUMIN 4.5 08/17/2020   CALCIUM 10.0 08/17/2020  ANIONGAP 12 11/24/2019   Lab Results  Component Value Date   CHOL 227 (H) 11/30/2019   Lab Results  Component Value Date   HDL 42 11/30/2019   Lab Results  Component Value Date   LDLCALC 158 (H) 11/30/2019   Lab Results  Component Value Date   TRIG 149 11/30/2019   Lab Results  Component Value Date   CHOLHDL 5.4 (H) 11/30/2019   Lab Results  Component Value Date   HGBA1C 6.3 (A) 11/30/2019      Assessment & Plan:   Problem List Items Addressed This Visit      Other   Hypokalemia Daily supplement labs pending   Relevant Orders   Comp. Metabolic Panel (12) (Completed)    Other Visit Diagnoses  Prediabetes  Consider home glucose monitoring Weight loss at least 5% of current body weight is can be achieved with lifestyle modification dietary changes and regular daily exercise Encourage blood pressure control goal <120/80 and maintaining total cholesterol <200 Follow-up every 3 to 6 months for reevaluation    Wheezing    -  Primary Encourage mask use and safety   Relevant Orders   CBC with Differential/Platelet (Completed)    Hypertension, unspecified type     Encouraged on going compliance with current medication regimen Encouraged home monitoring and recording BP <130/80 Eating a heart-healthy diet with less salt Encouraged regular physical activity    Relevant Medications   amLODipine (NORVASC) 5 MG tablet   Other Relevant Orders   Comp. Metabolic Panel (12) (Completed)   Encounter for hepatitis C virus screening test for high risk patient       Relevant Orders   Hepatitis C antibody (Completed)   Screening  for HIV (human immunodeficiency virus)       Relevant Orders   HIV Antibody (routine testing w rflx) (Completed)      Meds ordered this encounter  Medications  . omeprazole (PRILOSEC) 20 MG capsule    Sig: Take 1 capsule (20 mg total) by mouth daily as needed (acid reflux).    Dispense:  90 capsule    Refill:  3    Order Specific Question:   Supervising Provider    Answer:   Quentin Angst [id number]  . albuterol (PROAIR HFA) 108 (90 Base) MCG/ACT inhaler    Sig: Inhale 1-2 puffs into the lungs every 6 (six) hours as needed for wheezing or shortness of breath.    Dispense:  8 g    Refill:  2    Order Specific Question:   Supervising Provider    Answer:   Quentin Angst [id number]  . amLODipine (NORVASC) 5 MG tablet    Sig: Take 1 tablet (5 mg total) by mouth daily.    Dispense:  90 tablet    Refill:  3    Order Specific Question:   Supervising Provider    Answer:   Quentin Angst [[id number]]    Follow-up: Return in about 3 months (around 11/15/2020) for HTN with PCP 2 week nurse visit only (Thursday for TDAP and Flu vaccine thanks.    Barbette Merino, NP

## 2020-08-18 LAB — CBC WITH DIFFERENTIAL/PLATELET
Basophils Absolute: 0.1 10*3/uL (ref 0.0–0.2)
Basos: 1 %
EOS (ABSOLUTE): 0.8 10*3/uL — ABNORMAL HIGH (ref 0.0–0.4)
Eos: 8 %
Hematocrit: 44.5 % (ref 37.5–51.0)
Hemoglobin: 15.1 g/dL (ref 13.0–17.7)
Immature Grans (Abs): 0 10*3/uL (ref 0.0–0.1)
Immature Granulocytes: 0 %
Lymphocytes Absolute: 2.8 10*3/uL (ref 0.7–3.1)
Lymphs: 27 %
MCH: 28.5 pg (ref 26.6–33.0)
MCHC: 33.9 g/dL (ref 31.5–35.7)
MCV: 84 fL (ref 79–97)
Monocytes Absolute: 0.9 10*3/uL (ref 0.1–0.9)
Monocytes: 9 %
Neutrophils Absolute: 5.8 10*3/uL (ref 1.4–7.0)
Neutrophils: 55 %
Platelets: 297 10*3/uL (ref 150–450)
RBC: 5.29 x10E6/uL (ref 4.14–5.80)
RDW: 13.3 % (ref 11.6–15.4)
WBC: 10.5 10*3/uL (ref 3.4–10.8)

## 2020-08-18 LAB — COMP. METABOLIC PANEL (12)
AST: 19 IU/L (ref 0–40)
Albumin/Globulin Ratio: 1.5 (ref 1.2–2.2)
Albumin: 4.5 g/dL (ref 4.0–5.0)
Alkaline Phosphatase: 98 IU/L (ref 44–121)
BUN/Creatinine Ratio: 12 (ref 9–20)
BUN: 26 mg/dL — ABNORMAL HIGH (ref 6–20)
Bilirubin Total: <0.2 mg/dL (ref 0.0–1.2)
Calcium: 10 mg/dL (ref 8.7–10.2)
Chloride: 104 mmol/L (ref 96–106)
Creatinine, Ser: 2.1 mg/dL — ABNORMAL HIGH (ref 0.76–1.27)
GFR calc Af Amer: 44 mL/min/{1.73_m2} — ABNORMAL LOW (ref >59)
GFR calc non Af Amer: 38 mL/min/{1.73_m2} — ABNORMAL LOW (ref >59)
Globulin, Total: 3 g/dL (ref 1.5–4.5)
Glucose: 99 mg/dL (ref 65–99)
Potassium: 4.7 mmol/L (ref 3.5–5.2)
Sodium: 139 mmol/L (ref 134–144)
Total Protein: 7.5 g/dL (ref 6.0–8.5)

## 2020-08-18 LAB — HEPATITIS C ANTIBODY: Hep C Virus Ab: <0.1 s/co ratio (ref 0.0–0.9)

## 2020-08-18 LAB — HIV ANTIBODY (ROUTINE TESTING W REFLEX): HIV Screen 4th Generation wRfx: NONREACTIVE

## 2020-09-19 ENCOUNTER — Other Ambulatory Visit: Payer: Self-pay | Admitting: Family Medicine

## 2020-09-19 DIAGNOSIS — R519 Headache, unspecified: Secondary | ICD-10-CM

## 2020-09-19 DIAGNOSIS — F419 Anxiety disorder, unspecified: Secondary | ICD-10-CM

## 2020-09-19 NOTE — Telephone Encounter (Signed)
Is this okay to refill? 

## 2020-10-15 ENCOUNTER — Other Ambulatory Visit: Payer: Self-pay | Admitting: Family Medicine

## 2020-11-06 ENCOUNTER — Other Ambulatory Visit: Payer: Self-pay | Admitting: Nurse Practitioner

## 2020-11-07 NOTE — Telephone Encounter (Signed)
Please see refill request.

## 2020-11-10 ENCOUNTER — Other Ambulatory Visit: Payer: Self-pay | Admitting: Family Medicine

## 2020-11-15 ENCOUNTER — Ambulatory Visit: Payer: Self-pay | Admitting: Family Medicine

## 2020-11-28 ENCOUNTER — Ambulatory Visit (INDEPENDENT_AMBULATORY_CARE_PROVIDER_SITE_OTHER): Payer: 59 | Admitting: Family Medicine

## 2020-11-28 ENCOUNTER — Other Ambulatory Visit: Payer: Self-pay

## 2020-11-28 ENCOUNTER — Encounter: Payer: Self-pay | Admitting: Family Medicine

## 2020-11-28 VITALS — BP 136/92 | HR 57 | Temp 97.9°F | Ht 69.0 in | Wt 183.0 lb

## 2020-11-28 DIAGNOSIS — J302 Other seasonal allergic rhinitis: Secondary | ICD-10-CM

## 2020-11-28 DIAGNOSIS — Z09 Encounter for follow-up examination after completed treatment for conditions other than malignant neoplasm: Secondary | ICD-10-CM

## 2020-11-28 DIAGNOSIS — F419 Anxiety disorder, unspecified: Secondary | ICD-10-CM | POA: Diagnosis not present

## 2020-11-28 DIAGNOSIS — I10 Essential (primary) hypertension: Secondary | ICD-10-CM

## 2020-11-28 DIAGNOSIS — E876 Hypokalemia: Secondary | ICD-10-CM | POA: Diagnosis not present

## 2020-11-28 MED ORDER — CETIRIZINE HCL 10 MG PO TABS
10.0000 mg | ORAL_TABLET | Freq: Every day | ORAL | 11 refills | Status: DC
Start: 2020-11-28 — End: 2021-11-21

## 2020-11-28 MED ORDER — BUSPIRONE HCL 15 MG PO TABS: 15.0000 mg | ORAL_TABLET | Freq: Two times a day (BID) | ORAL | 11 refills | Status: DC

## 2020-11-28 NOTE — Progress Notes (Signed)
[facility]   Establish Patient Follow Up   Subjective:  Patient ID: Jeremiah FrederickKera Lafrance, male    DOB: Sep 13, 1980  Age: 40 y.o. MRN: [id number]  CC:  Chief Complaint  Patient presents with  . Follow-up    3 month follow up , allergy , need a refill on k+    HPI Jeremiah [name] is a 40 year old male who presents for Follow Up Visit today.   Patient Active Problem List   Diagnosis Date Noted  . Muscle twitching 02/17/2020  . Hypokalemia 02/17/2020  . Anxiety 01/05/2020  . Grief 01/05/2020  . Vitamin D deficiency 01/05/2020  . Hypertensive crisis 11/30/2019  . Hyperglycemia 11/30/2019  . Prediabetes 11/30/2019  . Chronic nonintractable headache 11/30/2019   Current Status: Since her last office visit, she is doing well with no complaints. She denies visual changes, chest pain, cough, shortness of breath, heart palpitations, and falls. She has occasional headaches and dizziness with position changes. Denies severe headaches, confusion, seizures, double vision, and blurred vision, nausea and vomiting. She denies fevers, chills, fatigue, recent infections, weight loss, and night sweats. Denies GI problems such as nausea, vomiting, diarrhea, and constipation. She has no reports of blood in stools, dysuria and hematuria. No depression or anxiety reported today. She is taking all medications as prescribed. She denies pain today.   Past Medical History:  Diagnosis Date  . Anxiety 01/04/2020  . Elevated lipids 11/2019  . Elevated serum creatinine 11/2019  . GERD (gastroesophageal reflux disease)   . Hypertension   . Hypertensive crisis 11/2019  . Hypokalemia 11/2019  . Vitamin D deficiency 11/2019   History reviewed. No pertinent surgical history.  Family History  Problem Relation Age of Onset  . Hypertension Father     Social History   Socioeconomic History  . Marital status: Significant Other    Spouse name: Not on file  . Number  of children: Not on file  . Years of education: Not on file  . Highest education level: Not on file  Occupational History  . Not on file  Tobacco Use  . Smoking status: Never Smoker  . Smokeless tobacco: Never Used  Vaping Use  . Vaping Use: Never used  Substance and Sexual Activity  . Alcohol use: Yes  . Drug use: No  . Sexual activity: Yes    Partners: Male  Other Topics Concern  . Not on file  Social History Narrative  . Not on file   Social Determinants of Health   Financial Resource Strain: Not on file  Food Insecurity: Not on file  Transportation Needs: Not on file  Physical Activity: Not on file  Stress: Not on file  Social Connections: Not on file  Intimate Partner Violence: Not on file    Outpatient Medications Prior to Visit  Medication Sig Dispense Refill  . albuterol (VENTOLIN HFA) 108 (90 Base) MCG/ACT inhaler INHALE 1-2 PUFFS BY MOUTH EVERY 6 HOURS AS NEEDED FOR WHEEZE OR SHORTNESS OF BREATH 8 each 11  . amLODipine (NORVASC) 5 MG tablet Take 1 tablet (5 mg total) by mouth daily. 90 tablet 3  . carvedilol (COREG) 6.25 MG tablet Take 1 tablet (6.25 mg total) by mouth 2 (two) times daily with a meal. 180 tablet 3  . cloNIDine (CATAPRES) 0.1 MG tablet TAKE 1 TABLET BY MOUTH THREE TIMES A DAY 90 tablet 1  . diphenhydrAMINE (BENADRYL) 25 MG tablet Take 25 mg by mouth every 8 (  eight) hours as needed for itching or allergies.     Marland Kitchen ibuprofen (ADVIL) 600 MG tablet TAKE 1 TABLET (600 MG TOTAL) BY MOUTH EVERY 8 (EIGHT) HOURS AS NEEDED. 30 tablet 3  . KLOR-CON M20 20 MEQ tablet TAKE 1 TABLET BY MOUTH EVERY DAY 90 tablet 1  . lisinopril (ZESTRIL) 10 MG tablet Take 1 tablet (10 mg total) by mouth daily. 90 tablet 3  . omeprazole (PRILOSEC) 20 MG capsule Take 1 capsule (20 mg total) by mouth daily as needed (acid reflux). 90 capsule 3  . Vitamin D, Ergocalciferol, (DRISDOL) 1.25 MG (50000 UNIT) CAPS capsule TAKE 1 CAPSULE (50,000 UNITS TOTAL) BY MOUTH EVERY 7 (SEVEN) DAYS. 12  capsule 2  . busPIRone (BUSPAR) 10 MG tablet TAKE 1 TABLET BY MOUTH TWICE A DAY 180 tablet 3   No facility-administered medications prior to visit.    No Known Allergies  ROS Review of Systems  Constitutional: Negative.   HENT: Negative.   Eyes: Negative.   Respiratory: Negative.   Cardiovascular: Negative.   Gastrointestinal: Negative.   Endocrine: Negative.   Genitourinary: Negative.   Musculoskeletal: Negative.   Skin: Negative.   Allergic/Immunologic: Negative.   Neurological: Positive for dizziness (occasional ) and headaches (occasional ).  Hematological: Negative.   Psychiatric/Behavioral: Negative.        Objective:    Physical Exam Vitals and nursing note reviewed.  Constitutional:      Appearance: Normal appearance.  HENT:     Head: Normocephalic and atraumatic.     Nose: Nose normal.     Mouth/Throat:     Mouth: Mucous membranes are moist.     Pharynx: Oropharynx is clear.  Cardiovascular:     Rate and Rhythm: Normal rate and regular rhythm.     Pulses: Normal pulses.     Heart sounds: Normal heart sounds.  Pulmonary:     Effort: Pulmonary effort is normal.     Breath sounds: Normal breath sounds.  Abdominal:     General: Bowel sounds are normal.  Musculoskeletal:        General: Normal range of motion.     Cervical back: Normal range of motion and neck supple.  Skin:    General: Skin is warm and dry.  Neurological:     General: No focal deficit present.     Mental Status: He is alert and oriented to person, place, and time.  Psychiatric:        Mood and Affect: Mood normal.        Thought Content: Thought content normal.        Judgment: Judgment normal.     BP (!) 136/92 (BP Location: Left Arm, Patient Position: Sitting, Cuff Size: Normal)   Pulse (!) 57   Temp 97.9 F (36.6 C) (Temporal)   Ht 5\' 9"  (1.753 m)   Wt 183 lb (83 kg)   SpO2 99%   BMI 27.02 kg/m  Wt Readings from Last 3 Encounters:  11/28/20 183 lb (83 kg)  08/17/20 179  lb (81.2 kg)  02/15/20 173 lb (78.5 kg)     Health Maintenance Due  Topic Date Due  . TETANUS/TDAP  Never done  . INFLUENZA VACCINE  Never done  . COVID-19 Vaccine (3 - Booster for Pfizer series) 06/28/2020    There are no preventive care reminders to display for this patient.  Lab Results  Component Value Date   TSH 0.507 11/30/2019   Lab Results  Component Value Date  WBC 10.5 08/17/2020   HGB 15.1 08/17/2020   HCT 44.5 08/17/2020   MCV 84 08/17/2020   PLT 297 08/17/2020   Lab Results  Component Value Date   NA 139 08/17/2020   K 4.6 11/28/2020   CO2 25 11/24/2019   GLUCOSE 99 08/17/2020   BUN 26 (H) 08/17/2020   CREATININE 2.10 (H) 08/17/2020   BILITOT <0.2 08/17/2020   ALKPHOS 98 08/17/2020   AST 19 08/17/2020   ALT 17 11/24/2019   PROT 7.5 08/17/2020   ALBUMIN 4.5 08/17/2020   CALCIUM 10.0 08/17/2020   ANIONGAP 12 11/24/2019   Lab Results  Component Value Date   CHOL 227 (H) 11/30/2019   Lab Results  Component Value Date   HDL 42 11/30/2019   Lab Results  Component Value Date   LDLCALC 158 (H) 11/30/2019   Lab Results  Component Value Date   TRIG 149 11/30/2019   Lab Results  Component Value Date   CHOLHDL 5.4 (H) 11/30/2019   Lab Results  Component Value Date   HGBA1C 6.3 (A) 11/30/2019      Assessment & Plan:   1. Seasonal allergies - cetirizine (ZYRTEC) 10 MG tablet; Take 1 tablet (10 mg total) by mouth daily.  Dispense: 30 tablet; Refill: 11  2. Anxiety Stable today.  - busPIRone (BUSPAR) 15 MG tablet; Take 1 tablet (15 mg total) by mouth 2 (two) times daily.  Dispense: 60 tablet; Refill: 11  3. Hypertension, unspecified type He will continue to take medications as prescribed, to decrease high sodium intake, excessive alcohol intake, increase potassium intake, smoking cessation, and increase physical activity of at least 30 minutes of cardio activity daily. He will continue to follow Heart Healthy or DASH diet.  4.  Hypokalemia - Potassium  5. Follow up He will follow up for Lab Only 02/2021. He will follow up in 1 year for Annual Physical   Meds ordered this encounter  Medications  . busPIRone (BUSPAR) 15 MG tablet    Sig: Take 1 tablet (15 mg total) by mouth 2 (two) times daily.    Dispense:  60 tablet    Refill:  11  . cetirizine (ZYRTEC) 10 MG tablet    Sig: Take 1 tablet (10 mg total) by mouth daily.    Dispense:  30 tablet    Refill:  11    Orders Placed This Encounter  Procedures  . Potassium   Referral Orders  No referral(s) requested today    Raliegh Ip, MSN, ANE, FNP-BC [facility] [address] [place], [place] [address] [phone] fax   Problem List Items Addressed This Visit      Other   Anxiety   Relevant Medications   busPIRone (BUSPAR) 15 MG tablet   Hypokalemia   Relevant Orders   Potassium (Completed)    Other Visit Diagnoses    Seasonal allergies    -  Primary   Relevant Medications   cetirizine (ZYRTEC) 10 MG tablet   Hypertension, unspecified type       Follow up          Meds ordered this encounter  Medications  . busPIRone (BUSPAR) 15 MG tablet    Sig: Take 1 tablet (15 mg total) by mouth 2 (two) times daily.    Dispense:  60 tablet    Refill:  11  . cetirizine (ZYRTEC) 10 MG tablet    Sig: Take 1 tablet (  10 mg total) by mouth daily.    Dispense:  30 tablet    Refill:  11    Follow-up: No follow-ups on file.    Kallie Locks, FNP

## 2020-11-29 ENCOUNTER — Encounter: Payer: Self-pay | Admitting: Family Medicine

## 2020-11-29 LAB — POTASSIUM: Potassium: 4.6 mmol/L (ref 3.5–5.2)

## 2020-12-30 ENCOUNTER — Other Ambulatory Visit: Payer: Self-pay

## 2020-12-30 MED ORDER — CLONIDINE HCL 0.1 MG PO TABS: 0.1000 mg | ORAL_TABLET | Freq: Three times a day (TID) | ORAL | 1 refills | Status: DC

## 2021-01-31 ENCOUNTER — Telehealth: Payer: Self-pay

## 2021-01-31 NOTE — Telephone Encounter (Signed)
Med refill   Viatmin D  Potassium Carvedalog

## 2021-02-01 ENCOUNTER — Other Ambulatory Visit: Payer: Self-pay | Admitting: Nurse Practitioner

## 2021-02-01 DIAGNOSIS — E559 Vitamin D deficiency, unspecified: Secondary | ICD-10-CM

## 2021-02-01 DIAGNOSIS — I10 Essential (primary) hypertension: Secondary | ICD-10-CM

## 2021-02-01 DIAGNOSIS — E876 Hypokalemia: Secondary | ICD-10-CM

## 2021-02-01 MED ORDER — VITAMIN D (ERGOCALCIFEROL) 1.25 MG (50000 UNIT) PO CAPS: 50000.0000 [IU] | ORAL_CAPSULE | ORAL | 2 refills | Status: DC

## 2021-02-01 MED ORDER — POTASSIUM CHLORIDE CRYS ER 20 MEQ PO TBCR: 20.0000 meq | EXTENDED_RELEASE_TABLET | Freq: Every day | ORAL | 1 refills | Status: DC

## 2021-02-01 MED ORDER — CARVEDILOL 6.25 MG PO TABS
6.2500 mg | ORAL_TABLET | Freq: Two times a day (BID) | ORAL | 3 refills | Status: DC
Start: 2021-02-01 — End: 2021-09-03

## 2021-02-01 NOTE — Telephone Encounter (Signed)
Medications refilled

## 2021-02-27 ENCOUNTER — Other Ambulatory Visit: Payer: Self-pay

## 2021-03-18 ENCOUNTER — Other Ambulatory Visit: Payer: Self-pay | Admitting: Nurse Practitioner

## 2021-04-20 ENCOUNTER — Other Ambulatory Visit: Payer: Self-pay | Admitting: Nurse Practitioner

## 2021-05-15 ENCOUNTER — Other Ambulatory Visit: Payer: Self-pay | Admitting: Nurse Practitioner

## 2021-05-29 ENCOUNTER — Other Ambulatory Visit: Payer: Self-pay | Admitting: Nurse Practitioner

## 2021-07-17 ENCOUNTER — Other Ambulatory Visit: Payer: Self-pay | Admitting: Nurse Practitioner

## 2021-07-17 DIAGNOSIS — I10 Essential (primary) hypertension: Secondary | ICD-10-CM

## 2021-07-17 DIAGNOSIS — I169 Hypertensive crisis, unspecified: Secondary | ICD-10-CM

## 2021-07-26 ENCOUNTER — Other Ambulatory Visit: Payer: Self-pay | Admitting: Nurse Practitioner

## 2021-07-26 DIAGNOSIS — E876 Hypokalemia: Secondary | ICD-10-CM

## 2021-08-13 ENCOUNTER — Other Ambulatory Visit: Payer: Self-pay | Admitting: Nurse Practitioner

## 2021-08-28 ENCOUNTER — Other Ambulatory Visit: Payer: Self-pay | Admitting: Nurse Practitioner

## 2021-09-03 ENCOUNTER — Other Ambulatory Visit: Payer: Self-pay

## 2021-09-03 ENCOUNTER — Encounter (HOSPITAL_COMMUNITY): Payer: Self-pay

## 2021-09-03 ENCOUNTER — Other Ambulatory Visit: Payer: Self-pay | Admitting: Nurse Practitioner

## 2021-09-03 ENCOUNTER — Ambulatory Visit (HOSPITAL_COMMUNITY)
Admission: EM | Admit: 2021-09-03 | Discharge: 2021-09-03 | Disposition: A | Discharge status: Home / Self Care | Payer: Managed Care, Other (non HMO) | Attending: Student | Admitting: Student

## 2021-09-03 DIAGNOSIS — Z760 Encounter for issue of repeat prescription: Secondary | ICD-10-CM

## 2021-09-03 DIAGNOSIS — I169 Hypertensive crisis, unspecified: Secondary | ICD-10-CM | POA: Diagnosis not present

## 2021-09-03 DIAGNOSIS — I10 Essential (primary) hypertension: Secondary | ICD-10-CM | POA: Diagnosis not present

## 2021-09-03 DIAGNOSIS — M7661 Achilles tendinitis, right leg: Secondary | ICD-10-CM

## 2021-09-03 DIAGNOSIS — E559 Vitamin D deficiency, unspecified: Secondary | ICD-10-CM

## 2021-09-03 MED ORDER — PREDNISONE 20 MG PO TABS: 20.0000 mg | ORAL_TABLET | Freq: Every day | ORAL | 0 refills | Status: AC

## 2021-09-03 MED ORDER — AMLODIPINE BESYLATE 5 MG PO TABS: 5.0000 mg | ORAL_TABLET | Freq: Every day | ORAL | 2 refills | Status: DC

## 2021-09-03 MED ORDER — CARVEDILOL 6.25 MG PO TABS: 6.2500 mg | ORAL_TABLET | Freq: Two times a day (BID) | ORAL | 2 refills | Status: DC

## 2021-09-03 MED ORDER — LISINOPRIL 10 MG PO TABS: 10.0000 mg | ORAL_TABLET | Freq: Every day | ORAL | 2 refills | Status: DC

## 2021-09-03 MED ORDER — CLONIDINE HCL 0.1 MG PO TABS: 0.1000 mg | ORAL_TABLET | Freq: Three times a day (TID) | ORAL | 2 refills | Status: DC

## 2021-09-03 NOTE — Discharge Instructions (Addendum)
-  Prednisone one pill daily x5 days, take with breakfast or lunch. Limit NSAIDs like ibuprofen while taking this medication. Can still take tylenol for pain.  -Rest, elevation, ice. Limit standing and walking while pain persists. Wear good shoes while working.  -Please check your blood pressure at home or at the pharmacy. If this continues to be >140/90, follow-up with your primary care provider for further blood pressure management/ medication titration. If you develop chest pain, shortness of breath, vision changes, the worst headache of your life- head straight to the ED or call 911. -Continue bloodpressure medications as directed: Amlodipine 5mg  qd, lisinopril 10mg  qd, carvedilol 6.25 bid, clonidine 0.1mg  tid. Follow-up with PCP for additional refills.

## 2021-09-03 NOTE — ED Triage Notes (Signed)
Pt presents to the office for right foot pain that started x 2 days. No known injury to foot.

## 2021-09-03 NOTE — ED Provider Notes (Signed)
[name]    CSN: [id number] Arrival date & time: 09/03/21  1016      History   Chief Complaint Chief Complaint  Patient presents with   Foot Pain    HPI Jeremiah [name] is a 41 y.o. male presenting with R foot pain, nontraumatic; and refill on BP medication.  -R foot pain x2 days - heel. No trauma but does endorse overuse.  Did eat red meat and seafood recently. Also wears too-small shoes. Denies sensation changes. Pain only with ambulation and dorsiflexion - back of foot pain. Hasn't tried interventions at home.  -Takes: Amlodipine 5mg  qd, lisinopril 10mg  qd, carvedilol 6.25 bid, clonidine 0.1mg  tid. Out of meds x1.5 weeks due to insurance issue. Denies CP, SOB, dizziness, weakness. Hasn't monitored BP at home.   HPI  Past Medical History:  Diagnosis Date   Anxiety 01/04/2020   Elevated lipids 11/2019   Elevated serum creatinine 11/2019   GERD (gastroesophageal reflux disease)    Hypertension    Hypertensive crisis 11/2019   Hypokalemia 11/2019   Vitamin D deficiency 11/2019    Patient Active Problem List   Diagnosis Date Noted   Muscle twitching 02/17/2020   Hypokalemia 02/17/2020   Anxiety 01/05/2020   Grief 01/05/2020   Vitamin D deficiency 01/05/2020   Hypertensive crisis 11/30/2019   Hyperglycemia 11/30/2019   Prediabetes 11/30/2019   Chronic nonintractable headache 11/30/2019    History reviewed. No pertinent surgical history.     Home Medications    Prior to Admission medications   Medication Sig Start Date End Date Taking? Authorizing Provider  amLODipine (NORVASC) 5 MG tablet Take 1 tablet (5 mg total) by mouth daily. 09/03/21  Yes Hazel Sams, PA-C  predniSONE (DELTASONE) 20 MG tablet Take 1 tablet (20 mg total) by mouth daily for 5 days. 09/03/21 09/08/21 Yes Hazel Sams, PA-C  albuterol (VENTOLIN HFA) 108 (90 Base) MCG/ACT inhaler INHALE 1-2 PUFFS BY MOUTH EVERY 6 HOURS AS NEEDED FOR WHEEZE OR SHORTNESS OF BREATH 11/07/20   Azzie Glatter, FNP  busPIRone (BUSPAR) 15 MG tablet Take 1 tablet (15 mg total) by mouth 2 (two) times daily. 11/28/20   Azzie Glatter, FNP  carvedilol (COREG) 6.25 MG tablet Take 1 tablet (6.25 mg total) by mouth 2 (two) times daily with a meal. 09/03/21 10/03/21  Hazel Sams, PA-C  cetirizine (ZYRTEC) 10 MG tablet Take 1 tablet (10 mg total) by mouth daily. 11/28/20   Azzie Glatter, FNP  cloNIDine (CATAPRES) 0.1 MG tablet Take 1 tablet (0.1 mg total) by mouth 3 (three) times daily. 09/03/21   Hazel Sams, PA-C  diphenhydrAMINE (BENADRYL) 25 MG tablet Take 25 mg by mouth every 8 (eight) hours as needed for itching or allergies.     [provider]  ibuprofen (ADVIL) 600 MG tablet TAKE 1 TABLET (600 MG TOTAL) BY MOUTH EVERY 8 (EIGHT) HOURS AS NEEDED. 09/20/20   Azzie Glatter, FNP  KLOR-CON M20 20 MEQ tablet TAKE 1 TABLET BY MOUTH EVERY DAY 07/26/21   Vevelyn Francois, NP  lisinopril (ZESTRIL) 10 MG tablet Take 1 tablet (10 mg total) by mouth daily. 09/03/21   Hazel Sams, PA-C  omeprazole (PRILOSEC) 20 MG capsule Take 1 capsule (20 mg total) by mouth daily as needed (acid reflux). 08/17/20 08/17/21  Vevelyn Francois, NP  Vitamin D, Ergocalciferol, (DRISDOL) 1.25 MG (50000 UNIT) CAPS capsule Take 1 capsule (50,000 Units total) by mouth every 7 (seven) days. 02/01/21  Vevelyn Francois, NP    Family History Family History  Problem Relation Age of Onset   Hypertension Father     Social History Social History   Tobacco Use   Smoking status: Never   Smokeless tobacco: Never  Vaping Use   Vaping Use: Never used  Substance Use Topics   Alcohol use: Yes   Drug use: No     Allergies   Patient has no known allergies.   Review of Systems Review of Systems  Musculoskeletal:        R foot pain  All other systems reviewed and are negative.   Physical Exam Triage Vital Signs ED Triage Vitals  Enc Vitals Group     BP 09/03/21 1132 (!) 178/134     Pulse Rate 09/03/21  1132 75     Resp 09/03/21 1132 16     Temp 09/03/21 1132 98.2 F (36.8 C)     Temp Source 09/03/21 1132 Oral     SpO2 09/03/21 1132 100 %     Weight --      Height --      Head Circumference --      Peak Flow --      Pain Score 09/03/21 1133 8     Pain Loc --      Pain Edu? --      Excl. in [place]? --    No data found.  Updated Vital Signs BP (!) 178/134 (BP Location: Left Arm)    Pulse 75    Temp 98.2 F (36.8 C) (Oral)    Resp 16    SpO2 100%   Visual Acuity Right Eye Distance:   Left Eye Distance:   Bilateral Distance:    Right Eye Near:   Left Eye Near:    Bilateral Near:     Physical Exam Vitals reviewed.  Constitutional:      Appearance: Normal appearance. He is not diaphoretic.  HENT:     Head: Normocephalic and atraumatic.     Mouth/Throat:     Mouth: Mucous membranes are moist.  Eyes:     Extraocular Movements: Extraocular movements intact.     Pupils: Pupils are equal, round, and reactive to light.  Cardiovascular:     Rate and Rhythm: Normal rate and regular rhythm.     Pulses:          Radial pulses are 2+ on the right side and 2+ on the left side.     Heart sounds: Normal heart sounds.  Pulmonary:     Effort: Pulmonary effort is normal.     Breath sounds: Normal breath sounds.  Abdominal:     Palpations: Abdomen is soft.     Tenderness: There is no abdominal tenderness. There is no guarding or rebound.  Musculoskeletal:     Right lower leg: No edema.     Left lower leg: No edema.     Comments: R foot: no skin changes. TTP achilles tendon. Pain elicited with dorsiflexion. Plantarflexion intact and without pain. No midfoot or malleolar tenderness. No calf tenderness. Ambulating with pain. Negative thompson test.  Skin:    General: Skin is warm.     Capillary Refill: Capillary refill takes less than 2 seconds.  Neurological:     General: No focal deficit present.     Mental Status: He is alert and oriented to person, place, and time.  Psychiatric:         Mood and Affect: Mood normal.  Behavior: Behavior normal.        Thought Content: Thought content normal.        Judgment: Judgment normal.     UC Treatments / Results  Labs (all labs ordered are listed, but only abnormal results are displayed) Labs Reviewed - No data to display  EKG   Radiology No results found.  Procedures Procedures (including critical care time)  Medications Ordered in UC Medications - No data to display  Initial Impression / Assessment and Plan / UC Course  I have reviewed the triage vital signs and the nursing notes.  Pertinent labs & imaging results that were available during my care of the patient were reviewed by me and considered in my medical decision making (see chart for details).     This patient is a very pleasant 41 y.o. year old male presenting with R achilles tendonitis and hypertensive urgency following running out of BP medications x10 days. No signs of hypertensive emergency- CP, SOB, dizziness, headaches, vision changes.   Current hypertension regimen: Amlodipine 5mg  qd, lisinopril 10mg  qd, carvedilol 6.25 bid, clonidine 0.1mg  tid. Out of meds x1.5 weeks due to insurance issue.  Hasn't monitored BP at home. Refilled today. Monitor BP at home, f/u with PCP for additional refills.   Given significantly elevated BP, sent low-dose prednisone only for the achilles tendonitis, which he can start after restarting BP medications. Also provided ASO brace. RICE.   ED return precautions discussed. Patient verbalizes understanding and agreement.     Final Clinical Impressions(s) / UC Diagnoses   Final diagnoses:  Tendonitis, Achilles, right  Essential hypertension  Medication refill     Discharge Instructions      -Prednisone one pill daily x5 days, take with breakfast or lunch. Limit NSAIDs like ibuprofen while taking this medication. Can still take tylenol for pain.  -Rest, elevation, ice. Limit standing and walking while  pain persists. Wear good shoes while working.  -Please check your blood pressure at home or at the pharmacy. If this continues to be >140/90, follow-up with your primary care provider for further blood pressure management/ medication titration. If you develop chest pain, shortness of breath, vision changes, the worst headache of your life- head straight to the ED or call 911. -Continue bloodpressure medications as directed: Amlodipine 5mg  qd, lisinopril 10mg  qd, carvedilol 6.25 bid, clonidine 0.1mg  tid. Follow-up with PCP for additional refills.      ED Prescriptions     Medication Sig Dispense Auth. Provider   predniSONE (DELTASONE) 20 MG tablet Take 1 tablet (20 mg total) by mouth daily for 5 days. 5 tablet Hazel Sams, PA-C   amLODipine (NORVASC) 5 MG tablet Take 1 tablet (5 mg total) by mouth daily. 30 tablet Hazel Sams, PA-C   carvedilol (COREG) 6.25 MG tablet Take 1 tablet (6.25 mg total) by mouth 2 (two) times daily with a meal. 60 tablet Hazel Sams, PA-C   cloNIDine (CATAPRES) 0.1 MG tablet Take 1 tablet (0.1 mg total) by mouth 3 (three) times daily. 90 tablet Hazel Sams, PA-C   lisinopril (ZESTRIL) 10 MG tablet Take 1 tablet (10 mg total) by mouth daily. 30 tablet Hazel Sams, PA-C      PDMP not reviewed this encounter.   Hazel Sams, PA-C 09/03/21 1231

## 2021-09-03 NOTE — ED Triage Notes (Signed)
Patient would like a refill on his medication for his blood pressure.

## 2021-09-11 ENCOUNTER — Ambulatory Visit (INDEPENDENT_AMBULATORY_CARE_PROVIDER_SITE_OTHER): Payer: Managed Care, Other (non HMO) | Admitting: Nurse Practitioner

## 2021-09-11 ENCOUNTER — Encounter: Payer: Self-pay | Admitting: Nurse Practitioner

## 2021-09-11 ENCOUNTER — Other Ambulatory Visit: Payer: Self-pay

## 2021-09-11 VITALS — BP 145/97 | HR 63 | Temp 98.0°F | Ht 69.0 in | Wt 181.4 lb

## 2021-09-11 DIAGNOSIS — K219 Gastro-esophageal reflux disease without esophagitis: Secondary | ICD-10-CM | POA: Diagnosis not present

## 2021-09-11 DIAGNOSIS — M79671 Pain in right foot: Secondary | ICD-10-CM | POA: Diagnosis not present

## 2021-09-11 MED ORDER — NAPROXEN 500 MG PO TABS: 500.0000 mg | ORAL_TABLET | Freq: Two times a day (BID) | ORAL | 0 refills | Status: DC

## 2021-09-11 MED ORDER — OMEPRAZOLE 20 MG PO CPDR: 20.0000 mg | DELAYED_RELEASE_CAPSULE | Freq: Every day | ORAL | 3 refills | Status: DC | PRN

## 2021-09-11 NOTE — Progress Notes (Signed)
[place], [place]  [address] Phone:  [phone]   Fax:  [phone] Subjective:   Patient ID: Jeremiah [name], male    DOB: 05-20-1981, 41 y.o.   MRN: [id number]  Chief Complaint  Patient presents with   Follow-up    Pt is here for right foot pain.  Pt states that it only hurts when he is standing and walking for a long period of time.   HPI Tayshaun Parziale 41 y.o. male  has a past medical history of Anxiety (01/04/2020), Elevated lipids (11/2019), Elevated serum creatinine (11/2019), GERD (gastroesophageal reflux disease), Hypertension, Hypertensive crisis (11/2019), Hypokalemia (11/2019), and Vitamin D deficiency (11/2019). To the [facility] for right foot pain.   Patient states that he has had right foot pain and swelling x 4-5 days. Denies any trauma or injury, states that symptoms began without any precipitating factors. Pain most pronounced in the right heel and increases with prolonged standing and walking down the steps. Was evaluated in urgent care and given prednisone and brace with temporary improvement in symptoms. Has not been taking any medications for pain. Rates pain 6-7/10 and describes as throbbing and aching.   Patient also requesting refill of prilosec. Denies any other complaints today. Denies any fatigue, chest pain, shortness of breath, HA or dizziness. Denies any blurred vision, numbness or tingling.  Past Medical History:  Diagnosis Date   Anxiety 01/04/2020   Elevated lipids 11/2019   Elevated serum creatinine 11/2019   GERD (gastroesophageal reflux disease)    Hypertension    Hypertensive crisis 11/2019   Hypokalemia 11/2019   Vitamin D deficiency 11/2019    History reviewed. No pertinent surgical history.  Family History  Problem Relation Age of Onset   Hypertension Father     Social History   Socioeconomic History   Marital status: Significant Other    Spouse name: Not on file   Number of children: Not on  file   Years of education: Not on file   Highest education level: Not on file  Occupational History   Not on file  Tobacco Use   Smoking status: Never   Smokeless tobacco: Never  Vaping Use   Vaping Use: Never used  Substance and Sexual Activity   Alcohol use: Yes   Drug use: No   Sexual activity: Yes    Partners: Male  Other Topics Concern   Not on file  Social History Narrative   Not on file   Social Determinants of Health   Financial Resource Strain: Not on file  Food Insecurity: Not on file  Transportation Needs: Not on file  Physical Activity: Not on file  Stress: Not on file  Social Connections: Not on file  Intimate Partner Violence: Not on file    Outpatient Medications Prior to Visit  Medication Sig Dispense Refill   albuterol (VENTOLIN HFA) 108 (90 Base) MCG/ACT inhaler INHALE 1-2 PUFFS BY MOUTH EVERY 6 HOURS AS NEEDED FOR WHEEZE OR SHORTNESS OF BREATH 8 each 11   amLODipine (NORVASC) 5 MG tablet Take 1 tablet (5 mg total) by mouth daily. 30 tablet 2   busPIRone (BUSPAR) 15 MG tablet Take 1 tablet (15 mg total) by mouth 2 (two) times daily. 60 tablet 11   carvedilol (COREG) 6.25 MG tablet Take 1 tablet (6.25 mg total) by mouth 2 (two) times daily with a meal. 60 tablet 2   cetirizine (ZYRTEC) 10 MG tablet Take 1 tablet (10 mg total) by  mouth daily. 30 tablet 11   cloNIDine (CATAPRES) 0.1 MG tablet Take 1 tablet (0.1 mg total) by mouth 3 (three) times daily. 90 tablet 2   diphenhydrAMINE (BENADRYL) 25 MG tablet Take 25 mg by mouth every 8 (eight) hours as needed for itching or allergies.      ibuprofen (ADVIL) 600 MG tablet TAKE 1 TABLET (600 MG TOTAL) BY MOUTH EVERY 8 (EIGHT) HOURS AS NEEDED. 30 tablet 3   KLOR-CON M20 20 MEQ tablet TAKE 1 TABLET BY MOUTH EVERY DAY 90 tablet 1   lisinopril (ZESTRIL) 10 MG tablet Take 1 tablet (10 mg total) by mouth daily. 30 tablet 2   Vitamin D, Ergocalciferol, (DRISDOL) 1.25 MG (50000 UNIT) CAPS capsule TAKE 1 CAPSULE (50,000  UNITS TOTAL) BY MOUTH EVERY 7 (SEVEN) DAYS 12 capsule 2   omeprazole (PRILOSEC) 20 MG capsule TAKE 1 CAPSULE (20 MG TOTAL) BY MOUTH DAILY AS NEEDED (ACID REFLUX). 90 capsule 3   No facility-administered medications prior to visit.    No Known Allergies  Review of Systems  Constitutional:  Negative for chills, fever and malaise/fatigue.  Respiratory:  Negative for cough and shortness of breath.   Cardiovascular:  Negative for chest pain, palpitations and leg swelling.  Gastrointestinal:  Negative for abdominal pain, blood in stool, constipation, diarrhea, nausea and vomiting.  Musculoskeletal:        Right foot pain   Skin: Negative.   Neurological: Negative.   Psychiatric/Behavioral:  Negative for depression. The patient is not nervous/anxious.   All other systems reviewed and are negative.     Objective:    Physical Exam Vitals reviewed.  Constitutional:      General: He is not in acute distress.    Appearance: Normal appearance. He is normal weight.  HENT:     Head: Normocephalic.  Cardiovascular:     Rate and Rhythm: Normal rate and regular rhythm.     Pulses: Normal pulses.     Heart sounds: Normal heart sounds.     Comments: No obvious peripheral edema Pulmonary:     Effort: Pulmonary effort is normal.     Breath sounds: Normal breath sounds.  Musculoskeletal:        General: No swelling or tenderness. Normal range of motion.     Right lower leg: No edema.     Left lower leg: No edema.     Comments: R foot: no skin changes. TTP achilles tendon. Pain elicited with dorsiflexion. Plantarflexion intact and without pain. No midfoot or malleolar tenderness. No calf tenderness. Patient able to ambulate without noticeable pain   Skin:    General: Skin is warm and dry.     Capillary Refill: Capillary refill takes less than 2 seconds.  Neurological:     General: No focal deficit present.     Mental Status: He is alert and oriented to person, place, and time.  Psychiatric:         Mood and Affect: Mood normal.        Behavior: Behavior normal.        Thought Content: Thought content normal.        Judgment: Judgment normal.    BP (!) 145/97    Pulse 63    Temp 98 F (36.7 C)    Ht 5\' 9"  (1.753 m)    Wt 181 lb 6.4 oz (82.3 kg)    SpO2 98%    BMI 26.79 kg/m  Wt Readings from Last 3 Encounters:  09/11/21 181  lb 6.4 oz (82.3 kg)  11/28/20 183 lb (83 kg)  08/17/20 179 lb (81.2 kg)    Immunization History  Administered Date(s) Administered   PFIZER(Purple Top)SARS-COV-2 Vaccination 11/26/2019, 12/28/2019    Diabetic Foot Exam - Simple   No data filed     Lab Results  Component Value Date   TSH 0.507 11/30/2019   Lab Results  Component Value Date   WBC 10.5 08/17/2020   HGB 15.1 08/17/2020   HCT 44.5 08/17/2020   MCV 84 08/17/2020   PLT 297 08/17/2020   Lab Results  Component Value Date   NA 139 08/17/2020   K 4.6 11/28/2020   CO2 25 11/24/2019   GLUCOSE 99 08/17/2020   BUN 26 (H) 08/17/2020   CREATININE 2.10 (H) 08/17/2020   BILITOT <0.2 08/17/2020   ALKPHOS 98 08/17/2020   AST 19 08/17/2020   ALT 17 11/24/2019   PROT 7.5 08/17/2020   ALBUMIN 4.5 08/17/2020   CALCIUM 10.0 08/17/2020   ANIONGAP 12 11/24/2019   Lab Results  Component Value Date   CHOL 227 (H) 11/30/2019   Lab Results  Component Value Date   HDL 42 11/30/2019   Lab Results  Component Value Date   LDLCALC 158 (H) 11/30/2019   Lab Results  Component Value Date   TRIG 149 11/30/2019   Lab Results  Component Value Date   CHOLHDL 5.4 (H) 11/30/2019   Lab Results  Component Value Date   HGBA1C 6.3 (A) 11/30/2019       Assessment & Plan:   Problem List Items Addressed This Visit   None Visit Diagnoses     Right foot pain    -  Primary   Relevant Medications   naproxen (NAPROSYN) 500 MG tablet Informed to take OTC medications as needed for pain  Discussed at length footwear options Discussed at length, non pharmacological options for management  of symptoms   Other Relevant Orders   AMB referral to orthopedics   Gastroesophageal reflux disease, unspecified whether esophagitis present       Relevant Medications   omeprazole (PRILOSEC) 20 MG capsule   Follow up in 1 mth for wellness visit, sooner as needed    I am having Mauricio Po start on naproxen. I am also having him maintain his diphenhydrAMINE, ibuprofen, albuterol, busPIRone, cetirizine, Klor-Con M20, Vitamin D (Ergocalciferol), amLODipine, carvedilol, cloNIDine, lisinopril, and omeprazole.  Meds ordered this encounter  Medications   omeprazole (PRILOSEC) 20 MG capsule    Sig: Take 1 capsule (20 mg total) by mouth daily as needed (acid reflux).    Dispense:  90 capsule    Refill:  3   naproxen (NAPROSYN) 500 MG tablet    Sig: Take 1 tablet (500 mg total) by mouth 2 (two) times daily with a meal.    Dispense:  14 tablet    Refill:  0     Teena Dunk, NP

## 2021-09-11 NOTE — Patient Instructions (Signed)
You were seen today in the [facility] for right foot pain.  You were prescribed medications, please take as directed. Please follow up in 1 mth for reevaluation of hypertension and right foot pain.

## 2021-09-18 ENCOUNTER — Ambulatory Visit: Payer: Managed Care, Other (non HMO) | Admitting: Physician Assistant

## 2021-10-12 ENCOUNTER — Ambulatory Visit: Payer: Self-pay | Admitting: Nurse Practitioner

## 2021-10-13 ENCOUNTER — Ambulatory Visit: Payer: Managed Care, Other (non HMO) | Admitting: Nurse Practitioner

## 2021-10-23 ENCOUNTER — Ambulatory Visit (INDEPENDENT_AMBULATORY_CARE_PROVIDER_SITE_OTHER): Payer: Managed Care, Other (non HMO) | Admitting: Nurse Practitioner

## 2021-10-23 ENCOUNTER — Other Ambulatory Visit: Payer: Self-pay

## 2021-10-23 ENCOUNTER — Encounter: Payer: Self-pay | Admitting: Nurse Practitioner

## 2021-10-23 VITALS — BP 153/101 | HR 65 | Temp 97.8°F | Ht 69.0 in | Wt 182.0 lb

## 2021-10-23 DIAGNOSIS — E119 Type 2 diabetes mellitus without complications: Secondary | ICD-10-CM

## 2021-10-23 DIAGNOSIS — I169 Hypertensive crisis, unspecified: Secondary | ICD-10-CM

## 2021-10-23 DIAGNOSIS — I10 Essential (primary) hypertension: Secondary | ICD-10-CM | POA: Diagnosis not present

## 2021-10-23 LAB — POCT GLYCOSYLATED HEMOGLOBIN (HGB A1C)
HbA1c POC (<> result, manual entry): 6.5 % (ref 4.0–5.6)
HbA1c, POC (controlled diabetic range): 6.5 % (ref 0.0–7.0)
HbA1c, POC (prediabetic range): 6.5 % — AB (ref 5.7–6.4)
Hemoglobin A1C: 6.5 % — AB (ref 4.0–5.6)

## 2021-10-23 MED ORDER — CARVEDILOL 6.25 MG PO TABS: 6.2500 mg | ORAL_TABLET | Freq: Two times a day (BID) | ORAL | 2 refills | Status: DC

## 2021-10-23 MED ORDER — LISINOPRIL 30 MG PO TABS: 30.0000 mg | ORAL_TABLET | Freq: Every day | ORAL | 0 refills | Status: DC

## 2021-10-23 MED ORDER — METFORMIN HCL 500 MG PO TABS: 500.0000 mg | ORAL_TABLET | Freq: Two times a day (BID) | ORAL | 3 refills | Status: DC

## 2021-10-23 MED ORDER — AMLODIPINE BESYLATE 10 MG PO TABS: 10.0000 mg | ORAL_TABLET | Freq: Every day | ORAL | 0 refills | Status: DC

## 2021-10-23 NOTE — Progress Notes (Signed)
[facility] Patient [facility] [address] [place], [place]  [address] Phone:  [phone]   Fax:  [phone] Subjective:   Patient ID: Jeremiah [name], male    DOB: [birth year], 41 y.o.   MRN: [id number]  Chief Complaint  Patient presents with   Follow-up    Pt is here for follow up visit for hypertension pt no longer have foot pain.    HPI Jeremiah [name] 41 y.o. male  has a past medical history of Anxiety (01/04/2020), Elevated lipids (11/2019), Elevated serum creatinine (11/2019), GERD (gastroesophageal reflux disease), Hypertension, Hypertensive crisis (11/2019), Hypokalemia (11/2019), and Vitamin D deficiency (11/2019).  To the [facility] for reevaluation of hypertension and wellness visit.   Patient states that since last visit, foot pain has subsided only reoccurring intermittently. Has been compliant with all medications, but has not been monitoring meals or exercing regularly.   Hypertension: Patient here for follow-up of elevated blood pressure. He is exercising and is adherent to low salt diet.  Patient does noted check B/P at home. Cardiac symptoms none. Patient denies chest pain, claudication, dyspnea, irregular heart beat, near-syncope, orthopnea, palpitations, and syncope.  Cardiovascular risk factors: hypertension, male gender, and sedentary lifestyle. Use of agents associated with hypertension: none. History of target organ damage: chronic kidney disease.    Denies any other complaints today. Denies any fever. Denies any fatigue, chest pain, shortness of breath, HA or dizziness. Denies any blurred vision, numbness or tingling.  Past Medical History:  Diagnosis Date   Anxiety 01/04/2020   Elevated lipids 11/2019   Elevated serum creatinine 11/2019   GERD (gastroesophageal reflux disease)    Hypertension    Hypertensive crisis 11/2019   Hypokalemia 11/2019   Vitamin D deficiency 11/2019    History reviewed. No pertinent surgical history.  Family History  Problem  Relation Age of Onset   Hypertension Father     Social History   Socioeconomic History   Marital status: Significant Other    Spouse name: Not on file   Number of children: Not on file   Years of education: Not on file   Highest education level: Not on file  Occupational History   Not on file  Tobacco Use   Smoking status: Never   Smokeless tobacco: Never  Vaping Use   Vaping Use: Never used  Substance and Sexual Activity   Alcohol use: Yes   Drug use: No   Sexual activity: Yes    Partners: Male  Other Topics Concern   Not on file  Social History Narrative   Not on file   Social Determinants of Health   Financial Resource Strain: Not on file  Food Insecurity: Not on file  Transportation Needs: Not on file  Physical Activity: Not on file  Stress: Not on file  Social Connections: Not on file  Intimate Partner Violence: Not on file    Outpatient Medications Prior to Visit  Medication Sig Dispense Refill   albuterol (VENTOLIN HFA) 108 (90 Base) MCG/ACT inhaler INHALE 1-2 PUFFS BY MOUTH EVERY 6 HOURS AS NEEDED FOR WHEEZE OR SHORTNESS OF BREATH 8 each 11   busPIRone (BUSPAR) 15 MG tablet Take 1 tablet (15 mg total) by mouth 2 (two) times daily. 60 tablet 11   cetirizine (ZYRTEC) 10 MG tablet Take 1 tablet (10 mg total) by mouth daily. 30 tablet 11   cloNIDine (CATAPRES) 0.1 MG tablet Take 1 tablet (0.1 mg total) by mouth 3 (three) times daily. 90 tablet 2  diphenhydrAMINE (BENADRYL) 25 MG tablet Take 25 mg by mouth every 8 (eight) hours as needed for itching or allergies.      ibuprofen (ADVIL) 600 MG tablet TAKE 1 TABLET (600 MG TOTAL) BY MOUTH EVERY 8 (EIGHT) HOURS AS NEEDED. 30 tablet 3   KLOR-CON M20 20 MEQ tablet TAKE 1 TABLET BY MOUTH EVERY DAY 90 tablet 1   omeprazole (PRILOSEC) 20 MG capsule Take 1 capsule (20 mg total) by mouth daily as needed (acid reflux). 90 capsule 3   Vitamin D, Ergocalciferol, (DRISDOL) 1.25 MG (50000 UNIT) CAPS capsule TAKE 1 CAPSULE  (50,000 UNITS TOTAL) BY MOUTH EVERY 7 (SEVEN) DAYS 12 capsule 2   amLODipine (NORVASC) 5 MG tablet Take 1 tablet (5 mg total) by mouth daily. 30 tablet 2   lisinopril (ZESTRIL) 10 MG tablet Take 1 tablet (10 mg total) by mouth daily. 30 tablet 2   naproxen (NAPROSYN) 500 MG tablet Take 1 tablet (500 mg total) by mouth 2 (two) times daily with a meal. (Patient not taking: Reported on 10/23/2021) 14 tablet 0   carvedilol (COREG) 6.25 MG tablet Take 1 tablet (6.25 mg total) by mouth 2 (two) times daily with a meal. 60 tablet 2   No facility-administered medications prior to visit.    No Known Allergies  Review of Systems  Constitutional:  Negative for chills, fever and malaise/fatigue.  HENT: Negative.    Eyes: Negative.   Respiratory:  Negative for cough and shortness of breath.   Cardiovascular:  Negative for chest pain, palpitations and leg swelling.  Gastrointestinal:  Negative for abdominal pain, blood in stool, constipation, diarrhea, nausea and vomiting.  Genitourinary: Negative.   Musculoskeletal: Negative.   Skin: Negative.   Neurological: Negative.   Psychiatric/Behavioral:  Negative for depression. The patient is not nervous/anxious.   All other systems reviewed and are negative.     Objective:    Physical Exam Vitals reviewed.  Constitutional:      General: He is not in acute distress.    Appearance: Normal appearance. He is normal weight.  HENT:     Head: Normocephalic.  Neck:     Vascular: No carotid bruit.  Cardiovascular:     Rate and Rhythm: Normal rate and regular rhythm.     Pulses: Normal pulses.     Heart sounds: Normal heart sounds.     Comments: No obvious peripheral edema Pulmonary:     Effort: Pulmonary effort is normal.     Breath sounds: Normal breath sounds.  Musculoskeletal:        General: No swelling, tenderness, deformity or signs of injury. Normal range of motion.     Cervical back: Normal range of motion and neck supple. No rigidity or  tenderness.     Right lower leg: No edema.     Left lower leg: No edema.  Lymphadenopathy:     Cervical: No cervical adenopathy.  Skin:    General: Skin is warm and dry.     Capillary Refill: Capillary refill takes less than 2 seconds.  Neurological:     General: No focal deficit present.     Mental Status: He is alert and oriented to person, place, and time.  Psychiatric:        Mood and Affect: Mood normal.        Behavior: Behavior normal.        Thought Content: Thought content normal.        Judgment: Judgment normal.    BP [place])  153/101 (BP Location: Right Arm, Patient Position: Sitting, Cuff Size: Normal)    Pulse 65    Temp 97.8 F (36.6 C)    Ht 5\' 9"  (1.753 m)    Wt 182 lb (82.6 kg)    SpO2 98%    BMI 26.88 kg/m  Wt Readings from Last 3 Encounters:  10/23/21 182 lb (82.6 kg)  09/11/21 181 lb 6.4 oz (82.3 kg)  11/28/20 183 lb (83 kg)    Immunization History  Administered Date(s) Administered   PFIZER(Purple Top)SARS-COV-2 Vaccination 11/26/2019, 12/28/2019    Diabetic Foot Exam - Simple   No data filed     Lab Results  Component Value Date   TSH 0.507 11/30/2019   Lab Results  Component Value Date   WBC 10.5 08/17/2020   HGB 15.1 08/17/2020   HCT 44.5 08/17/2020   MCV 84 08/17/2020   PLT 297 08/17/2020   Lab Results  Component Value Date   NA 139 08/17/2020   K 4.6 11/28/2020   CO2 25 11/24/2019   GLUCOSE 99 08/17/2020   BUN 26 (H) 08/17/2020   CREATININE 2.10 (H) 08/17/2020   BILITOT <0.2 08/17/2020   ALKPHOS 98 08/17/2020   AST 19 08/17/2020   ALT 17 11/24/2019   PROT 7.5 08/17/2020   ALBUMIN 4.5 08/17/2020   CALCIUM 10.0 08/17/2020   ANIONGAP 12 11/24/2019   Lab Results  Component Value Date   CHOL 227 (H) 11/30/2019   Lab Results  Component Value Date   HDL 42 11/30/2019   Lab Results  Component Value Date   LDLCALC 158 (H) 11/30/2019   Lab Results  Component Value Date   TRIG 149 11/30/2019   Lab Results  Component Value  Date   CHOLHDL 5.4 (H) 11/30/2019   Lab Results  Component Value Date   HGBA1C 6.3 (A) 11/30/2019       Assessment & Plan:   Problem List Items Addressed This Visit       Cardiovascular and Mediastinum   Hypertensive crisis   Relevant Medications   carvedilol (COREG) 6.25 MG tablet   lisinopril (ZESTRIL) 30 MG tablet, added to patient therapy    amLODipine (NORVASC) 10 MG tablet Encouraged continued diet and exercise efforts  Encouraged continued compliance with medication  Discussed diet and exercise at length   Other Visit Diagnoses     Hypertension, unspecified type    -  Primary   Relevant Medications   carvedilol (COREG) 6.25 MG tablet   lisinopril (ZESTRIL) 30 MG tablet   amLODipine (NORVASC) 10 MG tablet   Other Relevant Orders   CBC with Differential/Platelet   Comprehensive metabolic panel   Lipid panel Discussed diet and exercise at length  Patient given diet options   Follow up in 1 mth for reevaluation of hypertension, sooner as needed    I have changed Olamide Tsou's lisinopril and amLODipine. I am also having him maintain his diphenhydrAMINE, ibuprofen, albuterol, busPIRone, cetirizine, Klor-Con M20, Vitamin D (Ergocalciferol), cloNIDine, omeprazole, naproxen, and carvedilol.  Meds ordered this encounter  Medications   carvedilol (COREG) 6.25 MG tablet    Sig: Take 1 tablet (6.25 mg total) by mouth 2 (two) times daily with a meal.    Dispense:  60 tablet    Refill:  2   lisinopril (ZESTRIL) 30 MG tablet    Sig: Take 1 tablet (30 mg total) by mouth daily.    Dispense:  30 tablet    Refill:  0    DX  Code Needed  .   amLODipine (NORVASC) 10 MG tablet    Sig: Take 1 tablet (10 mg total) by mouth daily.    Dispense:  30 tablet    Refill:  0     Kathrynn Speed, NP

## 2021-10-23 NOTE — Patient Instructions (Signed)
You were seen today in the [facility] for reevaluation of hypertension. Labs were collected, results will be available via MyChart or, if abnormal, you will be contacted by clinic staff. You were prescribed medications, please take as directed. Please follow up in 1 mth for reevaluation of hypertension.

## 2021-10-24 LAB — LIPID PANEL
Chol/HDL Ratio: 5.6 ratio — ABNORMAL HIGH (ref 0.0–5.0)
Cholesterol, Total: 218 mg/dL — ABNORMAL HIGH (ref 100–199)
HDL: 39 mg/dL — ABNORMAL LOW (ref >39)
LDL Chol Calc (NIH): 114 mg/dL — ABNORMAL HIGH (ref 0–99)
Triglycerides: 372 mg/dL — ABNORMAL HIGH (ref 0–149)
VLDL Cholesterol Cal: 65 mg/dL — ABNORMAL HIGH (ref 5–40)

## 2021-10-24 LAB — COMPREHENSIVE METABOLIC PANEL
ALT: 9 IU/L (ref 0–44)
AST: 18 IU/L (ref 0–40)
Albumin/Globulin Ratio: 1.8 (ref 1.2–2.2)
Albumin: 4.7 g/dL (ref 4.0–5.0)
Alkaline Phosphatase: 100 IU/L (ref 44–121)
BUN/Creatinine Ratio: 11 (ref 9–20)
BUN: 24 mg/dL (ref 6–24)
Bilirubin Total: <0.2 mg/dL (ref 0.0–1.2)
CO2: 18 mmol/L — ABNORMAL LOW (ref 20–29)
Calcium: 10 mg/dL (ref 8.7–10.2)
Chloride: 103 mmol/L (ref 96–106)
Creatinine, Ser: 2.1 mg/dL — ABNORMAL HIGH (ref 0.76–1.27)
Globulin, Total: 2.6 g/dL (ref 1.5–4.5)
Glucose: 129 mg/dL — ABNORMAL HIGH (ref 70–99)
Potassium: 4.6 mmol/L (ref 3.5–5.2)
Sodium: 139 mmol/L (ref 134–144)
Total Protein: 7.3 g/dL (ref 6.0–8.5)
eGFR: 40 mL/min/{1.73_m2} — ABNORMAL LOW (ref >59)

## 2021-10-24 LAB — CBC WITH DIFFERENTIAL/PLATELET
Basophils Absolute: 0.1 10*3/uL (ref 0.0–0.2)
Basos: 1 %
EOS (ABSOLUTE): 0.8 10*3/uL — ABNORMAL HIGH (ref 0.0–0.4)
Eos: 8 %
Hematocrit: 49.1 % (ref 37.5–51.0)
Hemoglobin: 16.5 g/dL (ref 13.0–17.7)
Immature Grans (Abs): 0 10*3/uL (ref 0.0–0.1)
Immature Granulocytes: 0 %
Lymphocytes Absolute: 2.2 10*3/uL (ref 0.7–3.1)
Lymphs: 22 %
MCH: 29.3 pg (ref 26.6–33.0)
MCHC: 33.6 g/dL (ref 31.5–35.7)
MCV: 87 fL (ref 79–97)
Monocytes Absolute: 0.8 10*3/uL (ref 0.1–0.9)
Monocytes: 8 %
Neutrophils Absolute: 6 10*3/uL (ref 1.4–7.0)
Neutrophils: 61 %
Platelets: 306 10*3/uL (ref 150–450)
RBC: 5.63 x10E6/uL (ref 4.14–5.80)
RDW: 13.6 % (ref 11.6–15.4)
WBC: 9.9 10*3/uL (ref 3.4–10.8)

## 2021-10-30 ENCOUNTER — Other Ambulatory Visit: Payer: Self-pay | Admitting: Nurse Practitioner

## 2021-10-30 DIAGNOSIS — N1831 Chronic kidney disease, stage 3a: Secondary | ICD-10-CM

## 2021-10-30 DIAGNOSIS — E782 Mixed hyperlipidemia: Secondary | ICD-10-CM

## 2021-10-30 MED ORDER — OMEGA-3-ACID ETHYL ESTERS 1 G PO CAPS: 2.0000 g | ORAL_CAPSULE | Freq: Every day | ORAL | 2 refills | Status: DC

## 2021-10-30 MED ORDER — ATORVASTATIN CALCIUM 40 MG PO TABS: 40.0000 mg | ORAL_TABLET | Freq: Every day | ORAL | 3 refills | Status: DC

## 2021-11-20 ENCOUNTER — Telehealth: Payer: Self-pay

## 2021-11-20 ENCOUNTER — Other Ambulatory Visit: Payer: Self-pay | Admitting: Nurse Practitioner

## 2021-11-20 ENCOUNTER — Other Ambulatory Visit: Payer: Self-pay

## 2021-11-20 ENCOUNTER — Encounter: Payer: Self-pay | Admitting: Nurse Practitioner

## 2021-11-20 ENCOUNTER — Ambulatory Visit (INDEPENDENT_AMBULATORY_CARE_PROVIDER_SITE_OTHER): Payer: Managed Care, Other (non HMO) | Admitting: Nurse Practitioner

## 2021-11-20 VITALS — BP 114/76 | HR 62 | Temp 97.6°F | Ht 69.0 in | Wt 176.6 lb

## 2021-11-20 DIAGNOSIS — I10 Essential (primary) hypertension: Secondary | ICD-10-CM

## 2021-11-20 DIAGNOSIS — E7849 Other hyperlipidemia: Secondary | ICD-10-CM

## 2021-11-20 DIAGNOSIS — E119 Type 2 diabetes mellitus without complications: Secondary | ICD-10-CM

## 2021-11-20 DIAGNOSIS — I169 Hypertensive crisis, unspecified: Secondary | ICD-10-CM

## 2021-11-20 NOTE — Patient Instructions (Signed)
You were seen today in the [facility] for reevaluation of hypertension. Labs were collected, results will be available via MyChart or, if abnormal, you will be contacted by clinic staff. You were prescribed medications, please take as directed. Please follow up in 3 mths for reevaluation of chronic illness. ?

## 2021-11-20 NOTE — Progress Notes (Signed)
? ?[facility] ?[place], [place]  [address] ?Phone:  [phone]   Fax:  [phone] ?Subjective:  ? Patient ID: Jeremiah [name], male    DOB: [birth year], 41 y.o.   MRN: [id number] ? ?Chief Complaint  ?Patient presents with  ? Follow-up  ?  Pt is here for follow up visit BP. Pt is requesting a refill zyrtec, vitamin D, Buspar  ? ?HPI ?Jeremiah [name] 41 y.o. male  has a past medical history of Anxiety (01/04/2020), Elevated lipids (11/2019), Elevated serum creatinine (11/2019), GERD (gastroesophageal reflux disease), Hypertension, Hypertensive crisis (11/2019), Hypokalemia (11/2019), and Vitamin D deficiency (11/2019). To the [facility] for reevaluation of hypertension and refill of medications. ? ? Hypertension: Patient here for follow-up of elevated blood pressure. He is not exercising and is adherent to low salt diet. Does not check B/P at home. Cardiac symptoms none. Patient denies chest pain, claudication, dyspnea, and irregular heart beat.  Cardiovascular risk factors: diabetes mellitus, dyslipidemia, hypertension, and male gender. Use of agents associated with hypertension: none. History of target organ damage: chronic kidney disease.  ? ?Diabetes Mellitus: Patient presents for follow up of diabetes. Symptoms: none.Patient denies foot ulcerations, hyperglycemia, increase appetite, nausea, and paresthesia of the feet.  Evaluation to date has been included: hemoglobin A1C.  Home sugars: patient does not check sugars. Treatment to date: Continued metformin which has been effective.  ? ?Patient states that foot pain has resolved since last visit. Has made recommended changes to diet and has been compliant with all medications. Requesting medications refills. Denies any other concerns today.  ? ?Denies any fever. Denies any fatigue, chest pain, shortness of breath, HA or dizziness. Denies any blurred vision, numbness or tingling. ? ?Past Medical History:  ?Diagnosis Date  ? Anxiety  01/04/2020  ? Elevated lipids 11/2019  ? Elevated serum creatinine 11/2019  ? GERD (gastroesophageal reflux disease)   ? Hypertension   ? Hypertensive crisis 11/2019  ? Hypokalemia 11/2019  ? Vitamin D deficiency 11/2019  ? ? ?History reviewed. No pertinent surgical history. ? ?Family History  ?Problem Relation Age of Onset  ? Hypertension Father   ? ? ?Social History  ? ?Socioeconomic History  ? Marital status: Significant Other  ?  Spouse name: Not on file  ? Number of children: Not on file  ? Years of education: Not on file  ? Highest education level: Not on file  ?Occupational History  ? Not on file  ?Tobacco Use  ? Smoking status: Never  ? Smokeless tobacco: Never  ?Vaping Use  ? Vaping Use: Never used  ?Substance and Sexual Activity  ? Alcohol use: Yes  ? Drug use: No  ? Sexual activity: Yes  ?  Partners: Male  ?Other Topics Concern  ? Not on file  ?Social History Narrative  ? Not on file  ? ?Social Determinants of Health  ? ?Financial Resource Strain: Not on file  ?Food Insecurity: Not on file  ?Transportation Needs: Not on file  ?Physical Activity: Not on file  ?Stress: Not on file  ?Social Connections: Not on file  ?Intimate Partner Violence: Not on file  ? ? ?Outpatient Medications Prior to Visit  ?Medication Sig Dispense Refill  ? amLODipine (NORVASC) 10 MG tablet TAKE 1 TABLET(10 MG) BY MOUTH DAILY 30 tablet 0  ? atorvastatin (LIPITOR) 40 MG tablet Take 1 tablet (40 mg total) by mouth daily. 90 tablet 3  ? busPIRone (BUSPAR) 15 MG tablet Take 1 tablet (15 mg total)  by mouth 2 (two) times daily. 60 tablet 11  ? carvedilol (COREG) 6.25 MG tablet Take 1 tablet (6.25 mg total) by mouth 2 (two) times daily with a meal. 60 tablet 2  ? cetirizine (ZYRTEC) 10 MG tablet Take 1 tablet (10 mg total) by mouth daily. 30 tablet 11  ? cloNIDine (CATAPRES) 0.1 MG tablet Take 1 tablet (0.1 mg total) by mouth 3 (three) times daily. 90 tablet 2  ? diphenhydrAMINE (BENADRYL) 25 MG tablet Take 25 mg by mouth every 8 (eight)  hours as needed for itching or allergies.     ? ibuprofen (ADVIL) 600 MG tablet TAKE 1 TABLET (600 MG TOTAL) BY MOUTH EVERY 8 (EIGHT) HOURS AS NEEDED. 30 tablet 3  ? KLOR-CON M20 20 MEQ tablet TAKE 1 TABLET BY MOUTH EVERY DAY 90 tablet 1  ? lisinopril (ZESTRIL) 30 MG tablet TAKE 1 TABLET(30 MG) BY MOUTH DAILY 30 tablet 0  ? metFORMIN (GLUCOPHAGE) 500 MG tablet Take 1 tablet (500 mg total) by mouth 2 (two) times daily with a meal. 180 tablet 3  ? naproxen (NAPROSYN) 500 MG tablet Take 1 tablet (500 mg total) by mouth 2 (two) times daily with a meal. 14 tablet 0  ? omega-3 acid ethyl esters (LOVAZA) 1 g capsule Take 2 capsules (2 g total) by mouth daily. 60 capsule 2  ? omeprazole (PRILOSEC) 20 MG capsule Take 1 capsule (20 mg total) by mouth daily as needed (acid reflux). 90 capsule 3  ? Vitamin D, Ergocalciferol, (DRISDOL) 1.25 MG (50000 UNIT) CAPS capsule TAKE 1 CAPSULE (50,000 UNITS TOTAL) BY MOUTH EVERY 7 (SEVEN) DAYS 12 capsule 2  ? albuterol (VENTOLIN HFA) 108 (90 Base) MCG/ACT inhaler INHALE 1-2 PUFFS BY MOUTH EVERY 6 HOURS AS NEEDED FOR WHEEZE OR SHORTNESS OF BREATH 8 each 11  ? amLODipine (NORVASC) 5 MG tablet Take 5 mg by mouth daily.    ? ?No facility-administered medications prior to visit.  ? ? ?No Known Allergies ? ?Review of Systems  ?Constitutional:  Negative for chills, fever and malaise/fatigue.  ?Respiratory:  Negative for cough and shortness of breath.   ?Cardiovascular:  Negative for chest pain, palpitations and leg swelling.  ?Gastrointestinal:  Negative for abdominal pain, blood in stool, constipation, diarrhea, nausea and vomiting.  ?Genitourinary: Negative.   ?Musculoskeletal: Negative.   ?Skin: Negative.   ?Neurological: Negative.   ?Psychiatric/Behavioral:  Negative for depression. The patient is not nervous/anxious.   ?All other systems reviewed and are negative. ? ?   ?Objective:  ?  ?Physical Exam ?Vitals reviewed.  ?Constitutional:   ?   General: He is not in acute distress. ?    Appearance: Normal appearance. He is normal weight.  ?HENT:  ?   Head: Normocephalic.  ?Cardiovascular:  ?   Rate and Rhythm: Normal rate and regular rhythm.  ?   Pulses: Normal pulses.  ?   Heart sounds: Normal heart sounds.  ?   Comments: No obvious peripheral edema ?Pulmonary:  ?   Effort: Pulmonary effort is normal.  ?   Breath sounds: Normal breath sounds.  ?Musculoskeletal:     ?   General: No swelling, tenderness, deformity or signs of injury. Normal range of motion.  ?   Right lower leg: No edema.  ?   Left lower leg: No edema.  ?Skin: ?   General: Skin is warm and dry.  ?   Capillary Refill: Capillary refill takes less than 2 seconds.  ?Neurological:  ?   General: No  focal deficit present.  ?   Mental Status: He is alert and oriented to person, place, and time.  ?Psychiatric:     ?   Mood and Affect: Mood normal.     ?   Behavior: Behavior normal.     ?   Thought Content: Thought content normal.     ?   Judgment: Judgment normal.  ? ? ?BP 114/76 (BP Location: Right Arm, Patient Position: Sitting, Cuff Size: Normal)   Pulse 62   Temp 97.6 ?F (36.4 ?C)   Ht 5' 9" (1.753 m)   Wt 176 lb 9.6 oz (80.1 kg)   SpO2 100%   BMI 26.08 kg/m?  ?Wt Readings from Last 3 Encounters:  ?11/20/21 176 lb 9.6 oz (80.1 kg)  ?10/23/21 182 lb (82.6 kg)  ?09/11/21 181 lb 6.4 oz (82.3 kg)  ? ? ?Immunization History  ?Administered Date(s) Administered  ? PFIZER(Purple Top)SARS-COV-2 Vaccination 11/26/2019, 12/28/2019  ? ? ?Diabetic Foot Exam - Simple   ?No data filed ?  ? ? ?Lab Results  ?Component Value Date  ? TSH 0.507 11/30/2019  ? ?Lab Results  ?Component Value Date  ? WBC 9.9 10/23/2021  ? HGB 16.5 10/23/2021  ? HCT 49.1 10/23/2021  ? MCV 87 10/23/2021  ? PLT 306 10/23/2021  ? ?Lab Results  ?Component Value Date  ? NA 139 10/23/2021  ? K 4.6 10/23/2021  ? CO2 18 (L) 10/23/2021  ? GLUCOSE 129 (H) 10/23/2021  ? BUN 24 10/23/2021  ? CREATININE 2.10 (H) 10/23/2021  ? BILITOT <0.2 10/23/2021  ? ALKPHOS 100 10/23/2021  ? AST 18  10/23/2021  ? ALT 9 10/23/2021  ? PROT 7.3 10/23/2021  ? ALBUMIN 4.7 10/23/2021  ? CALCIUM 10.0 10/23/2021  ? ANIONGAP 12 11/24/2019  ? EGFR 40 (L) 10/23/2021  ? ?Lab Results  ?Component Value Date  ? CHOL 218 (H) 02/20

## 2021-11-20 NOTE — Telephone Encounter (Signed)
Zertex ?Vitamin D ?

## 2021-11-21 ENCOUNTER — Other Ambulatory Visit: Payer: Self-pay | Admitting: Nurse Practitioner

## 2021-11-21 DIAGNOSIS — E559 Vitamin D deficiency, unspecified: Secondary | ICD-10-CM

## 2021-11-21 DIAGNOSIS — J302 Other seasonal allergic rhinitis: Secondary | ICD-10-CM

## 2021-11-21 DIAGNOSIS — N1831 Chronic kidney disease, stage 3a: Secondary | ICD-10-CM

## 2021-11-21 LAB — COMPREHENSIVE METABOLIC PANEL
ALT: 11 IU/L (ref 0–44)
AST: 17 IU/L (ref 0–40)
Albumin/Globulin Ratio: 1.5 (ref 1.2–2.2)
Albumin: 4.4 g/dL (ref 4.0–5.0)
Alkaline Phosphatase: 111 IU/L (ref 44–121)
BUN/Creatinine Ratio: 12 (ref 9–20)
BUN: 31 mg/dL — ABNORMAL HIGH (ref 6–24)
Bilirubin Total: <0.2 mg/dL (ref 0.0–1.2)
CO2: 16 mmol/L — ABNORMAL LOW (ref 20–29)
Calcium: 10 mg/dL (ref 8.7–10.2)
Chloride: 105 mmol/L (ref 96–106)
Creatinine, Ser: 2.65 mg/dL — ABNORMAL HIGH (ref 0.76–1.27)
Globulin, Total: 2.9 g/dL (ref 1.5–4.5)
Glucose: 118 mg/dL — ABNORMAL HIGH (ref 70–99)
Potassium: 5 mmol/L (ref 3.5–5.2)
Sodium: 136 mmol/L (ref 134–144)
Total Protein: 7.3 g/dL (ref 6.0–8.5)
eGFR: 30 mL/min/{1.73_m2} — ABNORMAL LOW (ref >59)

## 2021-11-21 LAB — LIPID PANEL
Chol/HDL Ratio: 4.1 ratio (ref 0.0–5.0)
Cholesterol, Total: 128 mg/dL (ref 100–199)
HDL: 31 mg/dL — ABNORMAL LOW (ref >39)
LDL Chol Calc (NIH): 73 mg/dL (ref 0–99)
Triglycerides: 132 mg/dL (ref 0–149)
VLDL Cholesterol Cal: 24 mg/dL (ref 5–40)

## 2021-11-21 MED ORDER — CETIRIZINE HCL 10 MG PO TABS: 10.0000 mg | ORAL_TABLET | Freq: Every day | ORAL | 11 refills | Status: AC

## 2021-11-21 MED ORDER — VITAMIN D (ERGOCALCIFEROL) 1.25 MG (50000 UNIT) PO CAPS: 50000.0000 [IU] | ORAL_CAPSULE | ORAL | 2 refills | Status: DC

## 2021-11-27 ENCOUNTER — Encounter: Payer: Self-pay | Admitting: Nurse Practitioner

## 2021-12-18 ENCOUNTER — Other Ambulatory Visit: Payer: Self-pay | Admitting: Nurse Practitioner

## 2021-12-18 DIAGNOSIS — I10 Essential (primary) hypertension: Secondary | ICD-10-CM

## 2021-12-18 DIAGNOSIS — I169 Hypertensive crisis, unspecified: Secondary | ICD-10-CM

## 2022-01-02 ENCOUNTER — Other Ambulatory Visit: Payer: Self-pay

## 2022-01-02 DIAGNOSIS — F419 Anxiety disorder, unspecified: Secondary | ICD-10-CM

## 2022-01-02 MED ORDER — BUSPIRONE HCL 15 MG PO TABS: 15.0000 mg | ORAL_TABLET | Freq: Two times a day (BID) | ORAL | 11 refills | Status: DC

## 2022-01-08 ENCOUNTER — Telehealth: Payer: Self-pay

## 2022-01-08 NOTE — Telephone Encounter (Signed)
Omega 3 Acid 1 gm ?

## 2022-01-09 ENCOUNTER — Other Ambulatory Visit: Payer: Self-pay

## 2022-01-09 DIAGNOSIS — E782 Mixed hyperlipidemia: Secondary | ICD-10-CM

## 2022-01-09 MED ORDER — OMEGA-3-ACID ETHYL ESTERS 1 G PO CAPS: 2.0000 g | ORAL_CAPSULE | Freq: Every day | ORAL | 2 refills | Status: DC

## 2022-01-09 NOTE — Telephone Encounter (Signed)
Refills have been sent to the patient's pharmacy 

## 2022-01-15 ENCOUNTER — Other Ambulatory Visit: Payer: Self-pay

## 2022-01-16 MED ORDER — AMLODIPINE BESYLATE 5 MG PO TABS: 5.0000 mg | ORAL_TABLET | Freq: Every day | ORAL | 0 refills | Status: DC

## 2022-01-29 ENCOUNTER — Other Ambulatory Visit: Payer: Self-pay | Admitting: Nurse Practitioner

## 2022-01-29 DIAGNOSIS — I10 Essential (primary) hypertension: Secondary | ICD-10-CM

## 2022-02-20 ENCOUNTER — Ambulatory Visit: Payer: Self-pay | Admitting: Nurse Practitioner

## 2022-03-01 ENCOUNTER — Other Ambulatory Visit: Payer: Self-pay

## 2022-03-02 MED ORDER — CLONIDINE HCL 0.1 MG PO TABS: 0.1000 mg | ORAL_TABLET | Freq: Three times a day (TID) | ORAL | 2 refills | Status: DC

## 2022-08-07 ENCOUNTER — Other Ambulatory Visit: Payer: Self-pay | Admitting: Nurse Practitioner

## 2022-08-07 DIAGNOSIS — M79671 Pain in right foot: Secondary | ICD-10-CM

## 2022-08-07 NOTE — Telephone Encounter (Signed)
Jeremiah [name] is requesting to fill pt naproxen. Please advise [facility]

## 2022-08-07 NOTE — Telephone Encounter (Signed)
Please advise if you will fill. Thanks [facility] 

## 2022-09-05 ENCOUNTER — Other Ambulatory Visit: Payer: Self-pay | Admitting: Nurse Practitioner

## 2022-09-05 DIAGNOSIS — E782 Mixed hyperlipidemia: Secondary | ICD-10-CM

## 2022-10-15 ENCOUNTER — Ambulatory Visit (INDEPENDENT_AMBULATORY_CARE_PROVIDER_SITE_OTHER): Payer: 59 | Admitting: Family

## 2022-10-15 ENCOUNTER — Encounter: Payer: Self-pay | Admitting: Family

## 2022-10-15 VITALS — BP 200/140 | HR 74 | Temp 97.7°F | Resp 18 | Ht 69.0 in | Wt 175.0 lb

## 2022-10-15 DIAGNOSIS — F1290 Cannabis use, unspecified, uncomplicated: Secondary | ICD-10-CM

## 2022-10-15 DIAGNOSIS — E782 Mixed hyperlipidemia: Secondary | ICD-10-CM | POA: Diagnosis not present

## 2022-10-15 DIAGNOSIS — K219 Gastro-esophageal reflux disease without esophagitis: Secondary | ICD-10-CM

## 2022-10-15 DIAGNOSIS — E785 Hyperlipidemia, unspecified: Secondary | ICD-10-CM

## 2022-10-15 DIAGNOSIS — E876 Hypokalemia: Secondary | ICD-10-CM

## 2022-10-15 DIAGNOSIS — N183 Chronic kidney disease, stage 3 unspecified: Secondary | ICD-10-CM

## 2022-10-15 DIAGNOSIS — Z7689 Persons encountering health services in other specified circumstances: Secondary | ICD-10-CM

## 2022-10-15 DIAGNOSIS — Z113 Encounter for screening for infections with a predominantly sexual mode of transmission: Secondary | ICD-10-CM

## 2022-10-15 DIAGNOSIS — Z23 Encounter for immunization: Secondary | ICD-10-CM | POA: Diagnosis not present

## 2022-10-15 DIAGNOSIS — E559 Vitamin D deficiency, unspecified: Secondary | ICD-10-CM | POA: Diagnosis not present

## 2022-10-15 DIAGNOSIS — H6123 Impacted cerumen, bilateral: Secondary | ICD-10-CM | POA: Diagnosis not present

## 2022-10-15 DIAGNOSIS — E1169 Type 2 diabetes mellitus with other specified complication: Secondary | ICD-10-CM

## 2022-10-15 DIAGNOSIS — F411 Generalized anxiety disorder: Secondary | ICD-10-CM

## 2022-10-15 DIAGNOSIS — I129 Hypertensive chronic kidney disease with stage 1 through stage 4 chronic kidney disease, or unspecified chronic kidney disease: Secondary | ICD-10-CM | POA: Diagnosis not present

## 2022-10-15 DIAGNOSIS — R69 Illness, unspecified: Secondary | ICD-10-CM | POA: Diagnosis not present

## 2022-10-15 MED ORDER — ALBUTEROL SULFATE HFA 108 (90 BASE) MCG/ACT IN AERS: INHALATION_SPRAY | RESPIRATORY_TRACT | 11 refills | Status: DC

## 2022-10-15 MED ORDER — CLONIDINE HCL 0.1 MG PO TABS: 0.1000 mg | ORAL_TABLET | Freq: Three times a day (TID) | ORAL | 2 refills | Status: DC

## 2022-10-15 MED ORDER — AMLODIPINE BESYLATE 10 MG PO TABS: ORAL_TABLET | ORAL | 1 refills | Status: DC

## 2022-10-15 MED ORDER — ATORVASTATIN CALCIUM 40 MG PO TABS: 40.0000 mg | ORAL_TABLET | Freq: Every day | ORAL | 3 refills | Status: DC

## 2022-10-15 MED ORDER — OMEPRAZOLE 20 MG PO CPDR: 20.0000 mg | DELAYED_RELEASE_CAPSULE | Freq: Every day | ORAL | 3 refills | Status: DC | PRN

## 2022-10-15 MED ORDER — POTASSIUM CHLORIDE CRYS ER 20 MEQ PO TBCR: 20.0000 meq | EXTENDED_RELEASE_TABLET | Freq: Every day | ORAL | 1 refills | Status: DC

## 2022-10-15 MED ORDER — LISINOPRIL 30 MG PO TABS: ORAL_TABLET | ORAL | 1 refills | Status: DC

## 2022-10-15 MED ORDER — VITAMIN D (ERGOCALCIFEROL) 1.25 MG (50000 UNIT) PO CAPS: 50000.0000 [IU] | ORAL_CAPSULE | ORAL | 2 refills | Status: DC

## 2022-10-15 MED ORDER — CARVEDILOL 6.25 MG PO TABS: ORAL_TABLET | ORAL | 1 refills | Status: DC

## 2022-10-15 NOTE — Progress Notes (Signed)
Provider: Richarda Blade FNP-C   Aveya Beal, Donalee Citrin, NP  Patient Care Team: Talesha Ellithorpe, Donalee Citrin, NP as PCP - General (Family Medicine)  Extended Emergency Contact Information Primary Emergency Contact: Skoda,Benjamin Mobile Phone: [phone] Relation: Father Secondary Emergency Contact: bell,brandon Mobile Phone: [phone] Relation: Spouse  Code Status:  Full Code  Goals of care: Advanced Directive information    10/15/2022    8:48 AM  Advanced Directives  Does Patient Have a Medical Advance Directive? No  Would patient like information on creating a medical advance directive? No - Patient declined     Chief Complaint  Patient presents with   Establish Care    New patient to establish care. Patient has medication bottles at initial appt. [facility] verified    HPI:  Pt is a 42 y.o. 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  Hypertension - B/p elevated today though states has been high at home since he has been out of his medication for the past 3 weeks.  Type 2 DM - states was prediabetic was treated with metformin lost weight and was discontinued.  Goes to the Gym once a week for one hour.  Has not seen a podiatrist.   CKD stage b - last CR 2.65 referred by previous PCP to Nephrologist. States did not see the Nephrologist.   Hyperlipidemia - previous cholesterol,TRG and LDL were high but improved with latest labs 10 months ago.  Generalized anxiety - on Buspar but states Husband takes Hydroxyzine which he took while he was out of the medication and it works better. Would like Buspar switched to Hydroxyzine.   GERD - has symptoms every day.symptoms worst when he does not take Omeprazole.   Histroy of vaping - Quit one year ago vapped  for 4-5 months.  Does not smoking cigarette but smokes marijuana occasionally.   Also drinks alcohol socially.    Due for Influenza vaccine ,COVID-19,Tdap  vaccine   Past Medical History:  Diagnosis Date   Anxiety 01/04/2020   Elevated lipids 11/2019   Elevated serum creatinine 11/2019   GERD (gastroesophageal reflux disease)    Hypertension    Hypertensive crisis 11/2019   Hypokalemia 11/2019   Vitamin D deficiency 11/2019   History reviewed. No pertinent surgical history.  No Known Allergies  Allergies as of 10/15/2022   No Known Allergies      Medication List        Accurate as of October 15, 2022 11:59 PM. If you have any questions, ask your nurse or doctor.          STOP taking these medications    metFORMIN 500 MG tablet Commonly known as: GLUCOPHAGE Stopped by: Caesar Bookman, NP   naproxen 500 MG tablet Commonly known as: NAPROSYN Stopped by: Caesar Bookman, NP       TAKE these medications    albuterol 108 (90 Base) MCG/ACT inhaler Commonly known as: VENTOLIN HFA INHALE 1-2 PUFFS BY MOUTH EVERY 6 HOURS AS NEEDED FOR WHEEZE OR SHORTNESS OF BREATH   amLODipine 5 MG tablet Commonly known as: NORVASC Take 1 tablet (5 mg total) by mouth daily.   amLODipine 10 MG tablet Commonly known as: NORVASC TAKE 1 TABLET(10 MG) BY MOUTH DAILY   atorvastatin 40 MG tablet Commonly known as: LIPITOR Take 1 tablet (40  mg total) by mouth daily.   busPIRone 15 MG tablet Commonly known as: BUSPAR Take 1 tablet (15 mg total) by mouth 2 (two) times daily.   carvedilol 6.25 MG tablet Commonly known as: COREG TAKE 1 TABLET(6.25 MG) BY MOUTH TWICE DAILY WITH A MEAL   cetirizine 10 MG tablet Commonly known as: ZYRTEC Take 1 tablet (10 mg total) by mouth daily.   cloNIDine 0.1 MG tablet Commonly known as: CATAPRES Take 1 tablet (0.1 mg total) by mouth 3 (three) times daily.   diphenhydrAMINE 25 MG tablet Commonly known as: BENADRYL Take 25 mg by mouth  every 8 (eight) hours as needed for itching or allergies.   ibuprofen 600 MG tablet Commonly known as: ADVIL TAKE 1 TABLET (600 MG TOTAL) BY MOUTH EVERY 8 (EIGHT) HOURS AS NEEDED.   lisinopril 30 MG tablet Commonly known as: ZESTRIL TAKE 1 TABLET(30 MG) BY MOUTH DAILY   omega-3 acid ethyl esters 1 g capsule Commonly known as: LOVAZA TAKE 2 CAPSULES BY MOUTH DAILY   omeprazole 20 MG capsule Commonly known as: PRILOSEC Take 1 capsule (20 mg total) by mouth daily as needed (acid reflux).   potassium chloride SA 20 MEQ tablet Commonly known as: Klor-Con M20 Take 1 tablet (20 mEq total) by mouth daily. What changed: how much to take Changed by: Caesar Bookman, NP   Vitamin D (Ergocalciferol) 1.25 MG (50000 UNIT) Caps capsule Commonly known as: DRISDOL Take 1 capsule (50,000 Units total) by mouth every 7 (seven) days.        Review of Systems  Constitutional:  Negative for appetite change, chills, fatigue, fever and unexpected weight change.  HENT:  Negative for congestion, dental problem, ear discharge, ear pain, facial swelling, hearing loss, nosebleeds, postnasal drip, rhinorrhea, sinus pressure, sinus pain, sneezing, sore throat, tinnitus and trouble swallowing.   Eyes:  Negative for pain, discharge, redness, itching and visual disturbance.  Respiratory:  Negative for cough, chest tightness, shortness of breath and wheezing.   Cardiovascular:  Negative for chest pain, palpitations and leg swelling.  Gastrointestinal:  Negative for abdominal distention, abdominal pain, blood in stool, constipation, diarrhea, nausea and vomiting.  Endocrine: Negative for cold intolerance, heat intolerance, polydipsia, polyphagia and polyuria.  Genitourinary:  Negative for difficulty urinating, dysuria, flank pain, frequency and urgency.  Musculoskeletal:  Negative for arthralgias, back pain, gait problem, joint swelling, myalgias, neck pain and neck stiffness.  Skin:  Negative for color change,  pallor, rash and wound.  Neurological:  Negative for dizziness, syncope, speech difficulty, weakness, light-headedness, numbness and headaches.  Hematological:  Does not bruise/bleed easily.  Psychiatric/Behavioral:  Negative for agitation, behavioral problems, confusion, hallucinations, self-injury, sleep disturbance and suicidal ideas. The patient is not nervous/anxious.     Immunization History  Administered Date(s) Administered   Influenza,inj,Quad PF,6+ Mos 10/15/2022   PFIZER(Purple Top)SARS-COV-2 Vaccination 11/26/2019, 12/28/2019   Tdap 10/15/2022   Pertinent  Health Maintenance Due  Topic Date Due   INFLUENZA VACCINE  Completed      11/28/2020    8:35 AM 09/03/2021   11:34 AM 10/23/2021    9:15 AM 11/20/2021    9:04 AM 10/15/2022    8:49 AM  Fall Risk  Falls in the past year? 0  0 0 0  Was there an injury with Fall? 0   0 0  Fall Risk Category Calculator 0   0 0  Fall Risk Category (Retired) Low   Low   (RETIRED) Patient Fall Risk Level  Low fall risk  Low fall risk   Patient at Risk for Falls Due to No Fall Risks   No Fall Risks No Fall Risks  Fall risk Follow up Falls evaluation completed   Falls evaluation completed Falls evaluation completed   Functional Status Survey:    Vitals:   10/15/22 0851 10/15/22 0900  BP: (!) 200/140 (!) 200/140  Pulse: 74   Resp: 18   Temp: 97.7 F (36.5 C)   SpO2: 97%   Weight: 175 lb (79.4 kg)   Height: 5\' 9"  (1.753 m)    Body mass index is 25.84 kg/m. Physical Exam Vitals reviewed.  Constitutional:      General: He is not in acute distress.    Appearance: Normal appearance. He is overweight. He is not ill-appearing or diaphoretic.  HENT:     Head: Normocephalic.     Right Ear: Tympanic membrane, ear canal and external ear normal. There is no impacted cerumen.     Left Ear: Tympanic membrane, ear canal and external ear normal. There is no impacted cerumen.     Nose: Nose normal. No congestion or rhinorrhea.      Mouth/Throat:     Mouth: Mucous membranes are moist.     Pharynx: Oropharynx is clear. No oropharyngeal exudate or posterior oropharyngeal erythema.  Eyes:     General: No scleral icterus.       Right eye: No discharge.        Left eye: No discharge.     Extraocular Movements: Extraocular movements intact.     Conjunctiva/sclera: Conjunctivae normal.     Pupils: Pupils are equal, round, and reactive to light.  Neck:     Vascular: No carotid bruit.  Cardiovascular:     Rate and Rhythm: Normal rate and regular rhythm.     Pulses: Normal pulses.     Heart sounds: Normal heart sounds. No murmur heard.    No friction rub. No gallop.  Pulmonary:     Effort: Pulmonary effort is normal. No respiratory distress.     Breath sounds: Normal breath sounds. No wheezing, rhonchi or rales.  Chest:     Chest wall: No tenderness.  Abdominal:     General: Bowel sounds are normal. There is no distension.     Palpations: Abdomen is soft. There is no mass.     Tenderness: There is no abdominal tenderness. There is no right CVA tenderness, left CVA tenderness, guarding or rebound.  Musculoskeletal:        General: No swelling or tenderness. Normal range of motion.     Cervical back: Normal range of motion. No rigidity or tenderness.     Right lower leg: No edema.     Left lower leg: No edema.  Lymphadenopathy:     Cervical: No cervical adenopathy.  Skin:    General: Skin is warm and dry.     Coloration: Skin is not pale.     Findings: No bruising, erythema, lesion or rash.  Neurological:     Mental Status: He is alert and oriented to person, place, and time.     Cranial Nerves: No cranial nerve deficit.     Sensory: No sensory deficit.     Motor: No weakness.     Coordination: Coordination normal.     Gait: Gait normal.  Psychiatric:        Mood and Affect: Mood normal.        Speech: Speech normal.        Behavior: Behavior  normal.        Thought Content: Thought content normal.         Judgment: Judgment normal.     Labs reviewed: Recent Labs    10/23/21 1012 11/20/21 0928 10/15/22 0957  NA 139 136 138  K 4.6 5.0 3.3*  CL 103 105 100  CO2 18* 16* 26  GLUCOSE 129* 118* 116*  BUN 24 31* 34*  CREATININE 2.10* 2.65* 2.74*  CALCIUM 10.0 10.0 9.8   Recent Labs    10/23/21 1012 11/20/21 0928 10/15/22 0957  AST 18 17 35  ALT 9 11 23   ALKPHOS 100 111  --   BILITOT <0.2 <0.2 0.4  PROT 7.3 7.3 6.9  ALBUMIN 4.7 4.4  --    Recent Labs    10/23/21 1012 10/15/22 0957  WBC 9.9 13.2*  NEUTROABS 6.0 8,224*  HGB 16.5 16.8  HCT 49.1 48.1  MCV 87 82.8  PLT 306 356   Lab Results  Component Value Date   TSH 0.33 (L) 10/15/2022   Lab Results  Component Value Date   HGBA1C 7.2 (H) 10/15/2022   Lab Results  Component Value Date   CHOL 170 10/15/2022   HDL 46 10/15/2022   LDLCALC 95 10/15/2022   TRIG 196 (H) 10/15/2022   CHOLHDL 3.7 10/15/2022    Significant Diagnostic Results in last 30 days:  No results found.  Assessment/Plan 1. Encounter to establish care Available records reviewed,recommended fasting labs.immunization up to date except for Tdap.will receive vaccine today.    2. Type 2 diabetes mellitus with hyperlipidemia (HCC) Latest Hgb A1C 6.5  Diet controlled  -Advised to continue with dietary modification and exercise - - continue on Statin for cardiovascular event prevention  - On ACE inhibitor for renal protection. - continue with dietary modification and exercise at least 3 times per week for 30 minutes. - follow up with ophthalmology for annal eye exam and Podiatrist for foot exam.  Monofilament sensation intact. - Hemoglobin A1c - Microalbumin / creatinine urine ratio  3. Benign hypertension with chronic kidney disease, stage III (HCC) Blood pressure elevated.  Has been out of his blood pressure medication for 3 weeks.  Clonidine given will refill blood pressure medications then have him follow-up in 2 weeks to recheck blood  pressure. Increase amlodipine from 5 mg to 10 mg daily. Continue on Coreg,clonidine and lisinopril - TSH - COMPLETE METABOLIC PANEL WITH GFR - CBC with Differential/Platelet  4. Mixed hyperlipidemia Continue on atorvastatin Dietary modification and exercise advised - Lipid panel  5. Gastroesophageal reflux disease, unspecified whether esophagitis present Symptoms controlled. Previous H/H stable.No tarry or black stool  - advised to avoid eating meals late in the evening and to avoid aggravating foods and spices. - continue on Omeprazole    6. Generalized anxiety disorder Has been on buspirone but would like to switch to hydroxyzine states took husbands hydroxyzine and felt a little is working better than buspirone.  7. Vitamin D deficiency Continue on vitamin D supplements - Vitamin D, 1,25-dihydroxy  8. Screen for STD (sexually transmitted disease) Reports no high risk behaviors.  Has been with 1 partner since 2021 his last HIV test - HIV Antibody (routine testing w rflx)  9. Hypokalemia K+ 3.3 Will recheck lab work then replace if still low  10. Marijuana smoker, continuous Marijuana use cessation advised  11.Bilateral Cerumen impaction - Instill debrox 6.5 otic solution 5 drops into each ear twice daily x 4 days then follow up for ear lavage.May  apply cotton ball at bedtime to prevent drainage to pillow.   Family/ staff Communication: Reviewed plan of care with patient verbalized understanding  Labs/tests ordered:  - CBC with Differential/Platelet - CMP with eGFR([facility]) - TSH - Hgb A1C - Lipid panel - HIV Antibody (routine testing w rflx)  Next Appointment : Return in about 6 months (around 04/15/2023) for medical mangement of chronic issues. 2 weeks follow up high blood pressure and bil.ear lavage .   Caesar Bookman, NP

## 2022-10-16 ENCOUNTER — Other Ambulatory Visit: Payer: Self-pay | Admitting: *Deleted

## 2022-10-16 DIAGNOSIS — E559 Vitamin D deficiency, unspecified: Secondary | ICD-10-CM

## 2022-10-16 DIAGNOSIS — N183 Chronic kidney disease, stage 3 unspecified: Secondary | ICD-10-CM

## 2022-10-16 DIAGNOSIS — E782 Mixed hyperlipidemia: Secondary | ICD-10-CM

## 2022-10-16 DIAGNOSIS — E876 Hypokalemia: Secondary | ICD-10-CM

## 2022-10-16 DIAGNOSIS — K219 Gastro-esophageal reflux disease without esophagitis: Secondary | ICD-10-CM

## 2022-10-16 MED ORDER — CLONIDINE HCL 0.1 MG PO TABS: 0.1000 mg | ORAL_TABLET | Freq: Three times a day (TID) | ORAL | 2 refills | Status: DC

## 2022-10-16 MED ORDER — ALBUTEROL SULFATE HFA 108 (90 BASE) MCG/ACT IN AERS: INHALATION_SPRAY | RESPIRATORY_TRACT | 11 refills | Status: DC

## 2022-10-16 MED ORDER — OMEPRAZOLE 20 MG PO CPDR: 20.0000 mg | DELAYED_RELEASE_CAPSULE | Freq: Every day | ORAL | 1 refills | Status: DC | PRN

## 2022-10-16 MED ORDER — DEBROX 6.5 % OT SOLN: 5.0000 [drp] | Freq: Two times a day (BID) | OTIC | 0 refills | Status: DC

## 2022-10-16 MED ORDER — VITAMIN D (ERGOCALCIFEROL) 1.25 MG (50000 UNIT) PO CAPS: 50000.0000 [IU] | ORAL_CAPSULE | ORAL | 2 refills | Status: DC

## 2022-10-16 MED ORDER — AMLODIPINE BESYLATE 10 MG PO TABS: ORAL_TABLET | ORAL | 1 refills | Status: DC

## 2022-10-16 MED ORDER — CARVEDILOL 6.25 MG PO TABS: ORAL_TABLET | ORAL | 1 refills | Status: DC

## 2022-10-16 MED ORDER — LISINOPRIL 30 MG PO TABS: ORAL_TABLET | ORAL | 1 refills | Status: DC

## 2022-10-16 MED ORDER — HYDROXYZINE HCL 10 MG PO TABS: 10.0000 mg | ORAL_TABLET | Freq: Three times a day (TID) | ORAL | 0 refills | Status: DC | PRN

## 2022-10-16 MED ORDER — POTASSIUM CHLORIDE CRYS ER 20 MEQ PO TBCR: 20.0000 meq | EXTENDED_RELEASE_TABLET | Freq: Every day | ORAL | 1 refills | Status: DC

## 2022-10-16 MED ORDER — ATORVASTATIN CALCIUM 40 MG PO TABS: 40.0000 mg | ORAL_TABLET | Freq: Every day | ORAL | 1 refills | Status: DC

## 2022-10-16 NOTE — Telephone Encounter (Signed)
Medication refilled to requested pharmacy.

## 2022-10-16 NOTE — Telephone Encounter (Signed)
Patient called and stated that he told us the wrong pharmacy to call in his medications. He doesn't use [facility], it is [facility] and requesting the rx's to go there instead.   Pended Rx's and sent to [facility] for approval.

## 2022-10-18 LAB — HIV ANTIBODY (ROUTINE TESTING W REFLEX): HIV 1&2 Ab, 4th Generation: NONREACTIVE

## 2022-10-18 LAB — COMPLETE METABOLIC PANEL WITH GFR
AG Ratio: 1.2 (calc) (ref 1.0–2.5)
ALT: 23 U/L (ref 9–46)
AST: 35 U/L (ref 10–40)
Albumin: 3.8 g/dL (ref 3.6–5.1)
Alkaline phosphatase (APISO): 141 U/L — ABNORMAL HIGH (ref 36–130)
BUN/Creatinine Ratio: 12 (calc) (ref 6–22)
BUN: 34 mg/dL — ABNORMAL HIGH (ref 7–25)
CO2: 26 mmol/L (ref 20–32)
Calcium: 9.8 mg/dL (ref 8.6–10.3)
Chloride: 100 mmol/L (ref 98–110)
Creat: 2.74 mg/dL — ABNORMAL HIGH (ref 0.60–1.29)
Globulin: 3.1 g/dL (calc) (ref 1.9–3.7)
Glucose, Bld: 116 mg/dL — ABNORMAL HIGH (ref 65–99)
Potassium: 3.3 mmol/L — ABNORMAL LOW (ref 3.5–5.3)
Sodium: 138 mmol/L (ref 135–146)
Total Bilirubin: 0.4 mg/dL (ref 0.2–1.2)
Total Protein: 6.9 g/dL (ref 6.1–8.1)
eGFR: 29 mL/min/{1.73_m2} — ABNORMAL LOW (ref >=60)

## 2022-10-18 LAB — LIPID PANEL
Cholesterol: 170 mg/dL (ref <200)
HDL: 46 mg/dL (ref >=40)
LDL Cholesterol (Calc): 95 mg/dL (calc)
Non-HDL Cholesterol (Calc): 124 mg/dL (calc) (ref <130)
Total CHOL/HDL Ratio: 3.7 (calc) (ref <5.0)
Triglycerides: 196 mg/dL — ABNORMAL HIGH (ref <150)

## 2022-10-18 LAB — VITAMIN D 1,25 DIHYDROXY
Vitamin D 1, 25 (OH)2 Total: 37 pg/mL (ref 18–72)
Vitamin D2 1, 25 (OH)2: 37 pg/mL
Vitamin D3 1, 25 (OH)2: <8 pg/mL

## 2022-10-18 LAB — CBC WITH DIFFERENTIAL/PLATELET
Absolute Monocytes: 1016 cells/uL — ABNORMAL HIGH (ref 200–950)
Basophils Absolute: 106 cells/uL (ref 0–200)
Basophils Relative: 0.8 %
Eosinophils Absolute: 686 cells/uL — ABNORMAL HIGH (ref 15–500)
Eosinophils Relative: 5.2 %
HCT: 48.1 % (ref 38.5–50.0)
Hemoglobin: 16.8 g/dL (ref 13.2–17.1)
Lymphs Abs: 3168 cells/uL (ref 850–3900)
MCH: 28.9 pg (ref 27.0–33.0)
MCHC: 34.9 g/dL (ref 32.0–36.0)
MCV: 82.8 fL (ref 80.0–100.0)
MPV: 9.2 fL (ref 7.5–12.5)
Monocytes Relative: 7.7 %
Neutro Abs: 8224 cells/uL — ABNORMAL HIGH (ref 1500–7800)
Neutrophils Relative %: 62.3 %
Platelets: 356 10*3/uL (ref 140–400)
RBC: 5.81 10*6/uL — ABNORMAL HIGH (ref 4.20–5.80)
RDW: 13.3 % (ref 11.0–15.0)
Total Lymphocyte: 24 %
WBC: 13.2 10*3/uL — ABNORMAL HIGH (ref 3.8–10.8)

## 2022-10-18 LAB — MICROALBUMIN / CREATININE URINE RATIO
Creatinine, Urine: 196 mg/dL (ref 20–320)
Microalb Creat Ratio: 1555 mcg/mg creat — ABNORMAL HIGH (ref <30)
Microalb, Ur: 304.8 mg/dL

## 2022-10-18 LAB — TSH: TSH: 0.33 mIU/L — ABNORMAL LOW (ref 0.40–4.50)

## 2022-10-18 LAB — HEMOGLOBIN A1C
Hgb A1c MFr Bld: 7.2 % of total Hgb — ABNORMAL HIGH (ref <5.7)
Mean Plasma Glucose: 160 mg/dL
eAG (mmol/L): 8.9 mmol/L

## 2022-10-22 ENCOUNTER — Other Ambulatory Visit: Payer: Self-pay

## 2022-10-22 ENCOUNTER — Encounter: Payer: Self-pay | Admitting: Pharmacist

## 2022-10-22 DIAGNOSIS — E876 Hypokalemia: Secondary | ICD-10-CM

## 2022-10-22 DIAGNOSIS — R7989 Other specified abnormal findings of blood chemistry: Secondary | ICD-10-CM

## 2022-10-29 ENCOUNTER — Telehealth: Payer: Self-pay | Admitting: *Deleted

## 2022-10-29 ENCOUNTER — Ambulatory Visit: Payer: 59 | Admitting: Family

## 2022-10-29 ENCOUNTER — Other Ambulatory Visit: Payer: 59

## 2022-10-29 DIAGNOSIS — H6123 Impacted cerumen, bilateral: Secondary | ICD-10-CM

## 2022-10-29 MED ORDER — DEBROX 6.5 % OT SOLN: 5.0000 [drp] | Freq: Two times a day (BID) | OTIC | 0 refills | Status: DC

## 2022-10-29 NOTE — Telephone Encounter (Signed)
Patient stated that he has an appointment with Dinah tomorrow but did NOT use the drops for his ears due to medication not being at the pharmacy.   Patient requested to reschedule appointment in order to use the drops instead. Requested them to be sent to [facility].   Rx sent as requested.

## 2022-10-30 ENCOUNTER — Ambulatory Visit: Payer: 59 | Admitting: Family

## 2022-10-30 ENCOUNTER — Encounter: Payer: Self-pay | Admitting: Pharmacist

## 2022-11-05 ENCOUNTER — Encounter: Payer: Self-pay | Admitting: Family

## 2022-11-05 ENCOUNTER — Ambulatory Visit (INDEPENDENT_AMBULATORY_CARE_PROVIDER_SITE_OTHER): Payer: 59 | Admitting: Family

## 2022-11-05 VITALS — BP 124/82 | HR 58 | Temp 97.6°F | Ht 69.0 in | Wt 176.8 lb

## 2022-11-05 DIAGNOSIS — H6123 Impacted cerumen, bilateral: Secondary | ICD-10-CM

## 2022-11-05 DIAGNOSIS — E876 Hypokalemia: Secondary | ICD-10-CM

## 2022-11-05 DIAGNOSIS — N184 Chronic kidney disease, stage 4 (severe): Secondary | ICD-10-CM

## 2022-11-05 DIAGNOSIS — E785 Hyperlipidemia, unspecified: Secondary | ICD-10-CM

## 2022-11-05 DIAGNOSIS — R7989 Other specified abnormal findings of blood chemistry: Secondary | ICD-10-CM | POA: Diagnosis not present

## 2022-11-05 DIAGNOSIS — E1169 Type 2 diabetes mellitus with other specified complication: Secondary | ICD-10-CM

## 2022-11-05 DIAGNOSIS — N1831 Chronic kidney disease, stage 3a: Secondary | ICD-10-CM | POA: Diagnosis not present

## 2022-11-05 MED ORDER — GLIMEPIRIDE 1 MG PO TABS: 1.0000 mg | ORAL_TABLET | Freq: Every day | ORAL | 3 refills | Status: DC

## 2022-11-05 MED ORDER — ACCU-CHEK SOFTCLIX LANCETS MISC: 1 refills | Status: AC

## 2022-11-05 MED ORDER — ACCU-CHEK AVIVA PLUS VI STRP: ORAL_STRIP | 1 refills | Status: AC

## 2022-11-05 MED ORDER — ACCU-CHEK AVIVA PLUS W/DEVICE KIT: PACK | 0 refills | Status: AC

## 2022-11-05 NOTE — Progress Notes (Signed)
Provider: Marlowe Sax FNP-C  Doranne Schmutz, Nelda Bucks, NP  Patient Care Team: Keturah Yerby, Nelda Bucks, NP as PCP - General (Family Medicine)  Extended Emergency Contact Information Primary Emergency Contact: [place] Mobile Phone: [phone] Relation: Father Secondary Emergency Contact: bell,brandon Mobile Phone: [phone] Relation: Spouse  Code Status: Full code  Goals of care: Advanced Directive information    11/05/2022   10:57 AM  Advanced Directives  Does Patient Have a Medical Advance Directive? No  Would patient like information on creating a medical advance directive? No - Patient declined     Chief Complaint  Patient presents with   Medical Management of Chronic Issues    Medical Management of Chronic Issues. Ear Lavage.    Immunizations    To discuss need for Covid Vaccine or postpone if patient refuses.     HPI:  Pt is a 42 y.o. male seen today for an acute visit for 2 weeks follow up for high blood pressure,bilateral ear lavage and discuss lab results.He was here to establish care on 10/15/2022 B/p was 200/140 had been out of his medication for 3 weeks.He was advised to increase amlodipine to 10 mg tablet daily and continued on Coreg,clonidine and lisinopril. He was also advised to instill debrox 6.5 % otic solution to both ears twice daily  x 4 days then follow up for ear lavage.  His recent lab results discussed and reviewed during the visit.  TRG 196 ,Glucose 116,hgb A1C 7.2 previous was 6.5 currently off metformin.He does not check his blood sugars at home.discuss checking CBG at least once a day.will order glucometer and supplies this visit.    TSH 0.33   BUN 34,CR 2.74 ( previous 2.65)   K+ 3.3  not on supplement.   Past Medical History:  Diagnosis Date   Anxiety 01/04/2020   Elevated lipids 11/2019   Elevated serum creatinine 11/2019   GERD (gastroesophageal reflux disease)    Hypertension    Hypertensive crisis 11/2019   Hypokalemia 11/2019    Vitamin D deficiency 11/2019   History reviewed. No pertinent surgical history.  No Known Allergies  Outpatient Encounter Medications as of 11/05/2022  Medication Sig   albuterol (VENTOLIN HFA) 108 (90 Base) MCG/ACT inhaler INHALE 1-2 PUFFS BY MOUTH EVERY 6 HOURS AS NEEDED FOR WHEEZE OR SHORTNESS OF BREATH   amLODipine (NORVASC) 10 MG tablet TAKE 1 TABLET(10 MG) BY MOUTH DAILY   atorvastatin (LIPITOR) 40 MG tablet Take 1 tablet (40 mg total) by mouth daily.   busPIRone (BUSPAR) 15 MG tablet Take 15 mg by mouth 2 (two) times daily.   carbamide peroxide (DEBROX) 6.5 % OTIC solution Place 5 drops into both ears 2 (two) times daily.   carvedilol (COREG) 6.25 MG tablet TAKE 1 TABLET(6.25 MG) BY MOUTH TWICE DAILY WITH A MEAL   cetirizine (ZYRTEC) 10 MG tablet Take 1 tablet (10 mg total) by mouth daily.   cloNIDine (CATAPRES) 0.1 MG tablet Take 1 tablet (0.1 mg total) by mouth 3 (three) times daily.   diphenhydrAMINE (BENADRYL) 25 MG tablet Take 25 mg by mouth every 8 (eight) hours as needed for itching or allergies.    glimepiride (AMARYL) 1 MG tablet Take 1 tablet (1 mg total) by mouth daily with breakfast.   hydrOXYzine (ATARAX) 10 MG tablet Take 1 tablet (10 mg total) by mouth 3 (three) times daily as needed for anxiety.   ibuprofen (ADVIL) 600 MG tablet TAKE 1 TABLET (600 MG TOTAL) BY MOUTH EVERY 8 (EIGHT) HOURS AS  NEEDED.   lisinopril (ZESTRIL) 30 MG tablet TAKE 1 TABLET(30 MG) BY MOUTH DAILY   omega-3 acid ethyl esters (LOVAZA) 1 g capsule TAKE 2 CAPSULES BY MOUTH DAILY   omeprazole (PRILOSEC) 20 MG capsule Take 1 capsule (20 mg total) by mouth daily as needed (acid reflux).   potassium chloride SA (KLOR-CON M20) 20 MEQ tablet Take 1 tablet (20 mEq total) by mouth daily.   Vitamin D, Ergocalciferol, (DRISDOL) 1.25 MG (50000 UNIT) CAPS capsule Take 1 capsule (50,000 Units total) by mouth every 7 (seven) days.   [DISCONTINUED] amLODipine (NORVASC) 5 MG tablet Take 1 tablet (5 mg total) by  mouth daily.   No facility-administered encounter medications on file as of 11/05/2022.    Review of Systems  Constitutional:  Negative for appetite change, chills, fatigue, fever and unexpected weight change.  HENT:  Negative for congestion, dental problem, ear discharge, ear pain, facial swelling, hearing loss, nosebleeds, postnasal drip, rhinorrhea, sinus pressure, sinus pain, sneezing, sore throat, tinnitus and trouble swallowing.   Eyes:  Negative for pain, discharge, redness, itching and visual disturbance.  Respiratory:  Negative for cough, chest tightness, shortness of breath and wheezing.   Cardiovascular:  Negative for chest pain, palpitations and leg swelling.  Gastrointestinal:  Negative for abdominal distention, abdominal pain, blood in stool, constipation, diarrhea, nausea and vomiting.  Endocrine: Negative for cold intolerance, heat intolerance, polydipsia, polyphagia and polyuria.  Genitourinary:  Negative for difficulty urinating, dysuria, flank pain, frequency and urgency.  Musculoskeletal:  Negative for arthralgias, back pain, gait problem, joint swelling, myalgias, neck pain and neck stiffness.  Skin:  Negative for color change, pallor, rash and wound.  Neurological:  Negative for dizziness, speech difficulty, weakness, light-headedness, numbness and headaches.    Immunization History  Administered Date(s) Administered   Influenza,inj,Quad PF,6+ Mos 10/15/2022   PFIZER(Purple Top)SARS-COV-2 Vaccination 11/26/2019, 12/28/2019   Tdap 10/15/2022   Pertinent  Health Maintenance Due  Topic Date Due   INFLUENZA VACCINE  Completed      09/03/2021   11:34 AM 10/23/2021    9:15 AM 11/20/2021    9:04 AM 10/15/2022    8:49 AM 11/05/2022   10:56 AM  Fall Risk  Falls in the past year?  0 0 0 0  Was there an injury with Fall?   0 0 1  Fall Risk Category Calculator   0 0 1  Fall Risk Category (Retired)   Low    (RETIRED) Patient Fall Risk Level Low fall risk  Low fall risk     Patient at Risk for Falls Due to   No Fall Risks No Fall Risks   Fall risk Follow up   Falls evaluation completed Falls evaluation completed    Functional Status Survey:    Vitals:   11/05/22 1057  BP: 124/82  Pulse: (!) 58  Temp: 97.6 F (36.4 C)  SpO2: 98%  Weight: 176 lb 12.8 oz (80.2 kg)  Height: '5\' 9"'$  (1.753 m)   Body mass index is 26.11 kg/m. Physical Exam Vitals reviewed.  Constitutional:      General: He is not in acute distress.    Appearance: Normal appearance. He is overweight. He is not ill-appearing or diaphoretic.  HENT:     Head: Normocephalic.     Right Ear: There is impacted cerumen.     Left Ear: There is impacted cerumen.     Ears:     Comments: Bilateral ear cerumen lavaged with warm water and hydrogen peroxide moderate amounts of  cerumen obtained.No instrument used.Tolerated procedure well.TM clear without any signs of infection.     Nose: Nose normal. No congestion or rhinorrhea.     Mouth/Throat:     Mouth: Mucous membranes are moist.     Pharynx: Oropharynx is clear. No oropharyngeal exudate or posterior oropharyngeal erythema.  Eyes:     General: No scleral icterus.       Right eye: No discharge.        Left eye: No discharge.     Extraocular Movements: Extraocular movements intact.     Conjunctiva/sclera: Conjunctivae normal.     Pupils: Pupils are equal, round, and reactive to light.  Neck:     Vascular: No carotid bruit.  Cardiovascular:     Rate and Rhythm: Normal rate and regular rhythm.     Pulses: Normal pulses.     Heart sounds: Normal heart sounds. No murmur heard.    No friction rub. No gallop.  Pulmonary:     Effort: Pulmonary effort is normal. No respiratory distress.     Breath sounds: Normal breath sounds. No wheezing, rhonchi or rales.  Chest:     Chest wall: No tenderness.  Abdominal:     General: Bowel sounds are normal. There is no distension.     Palpations: Abdomen is soft. There is no mass.     Tenderness: There  is no abdominal tenderness. There is no right CVA tenderness, left CVA tenderness, guarding or rebound.  Musculoskeletal:        General: No swelling or tenderness. Normal range of motion.     Cervical back: Normal range of motion. No rigidity or tenderness.     Right lower leg: No edema.     Left lower leg: No edema.  Lymphadenopathy:     Cervical: No cervical adenopathy.  Skin:    General: Skin is warm and dry.     Coloration: Skin is not pale.     Findings: No bruising, erythema, lesion or rash.  Neurological:     Mental Status: He is alert and oriented to person, place, and time.     Cranial Nerves: No cranial nerve deficit.     Sensory: No sensory deficit.     Motor: No weakness.     Coordination: Coordination normal.     Gait: Gait normal.  Psychiatric:        Mood and Affect: Mood normal.        Speech: Speech normal.        Behavior: Behavior normal.        Thought Content: Thought content normal.        Judgment: Judgment normal.     Labs reviewed: Recent Labs    11/20/21 0928 10/15/22 0957  NA 136 138  K 5.0 3.3*  CL 105 100  CO2 16* 26  GLUCOSE 118* 116*  BUN 31* 34*  CREATININE 2.65* 2.74*  CALCIUM 10.0 9.8   Recent Labs    11/20/21 0928 10/15/22 0957  AST 17 35  ALT 11 23  ALKPHOS 111  --   BILITOT <0.2 0.4  PROT 7.3 6.9  ALBUMIN 4.4  --    Recent Labs    10/15/22 0957  WBC 13.2*  NEUTROABS 8,224*  HGB 16.8  HCT 48.1  MCV 82.8  PLT 356   Lab Results  Component Value Date   TSH 0.33 (L) 10/15/2022   Lab Results  Component Value Date   HGBA1C 7.2 (H) 10/15/2022   Lab Results  Component Value Date   CHOL 170 10/15/2022   HDL 46 10/15/2022   LDLCALC 95 10/15/2022   TRIG 196 (H) 10/15/2022   CHOLHDL 3.7 10/15/2022    Significant Diagnostic Results in last 30 days:  No results found.  Assessment/Plan 1. Hypokalemia K+ 3.3  Will recheck level then treat if indicated - Basic metabolic panel  2. Low thyroid stimulating  hormone (TSH) level Lab Results  Component Value Date   TSH 0.33 (L) 10/15/2022  Will recheck T3 and T4  - T3, free - T4, free  3. Type 2 diabetes mellitus with hyperlipidemia (HCC) Lab Results  Component Value Date   HGBA1C 7.2 (H) 10/15/2022  No home CBG for review.A1C has worsen. Start on Glimepiride below.side effects discussed.Advised to notify provider for CBG < 75  - glimepiride (AMARYL) 1 MG tablet; Take 1 tablet (1 mg total) by mouth daily with breakfast.  Dispense: 30 tablet; Refill: 3  4. Stage 4 chronic kidney disease (HCC) CR has worsen 2.74 previous 2.65  Will refer to Nephrologist for further evaluation.  - Avoid nephrotoxins - Basic metabolic panel - Ambulatory referral to Nephrology  5. Bilateral impacted cerumen Bilateral ear cerumen lavaged with warm water and hydrogen peroxide moderate amounts of cerumen obtained.No instrument used.Tolerated procedure well.TM clear without any signs of infection.  Family/ staff Communication: Reviewed plan of care with patient verbalized understanding   Labs/tests ordered:   - T3, free - T4, free - Basic metabolic panel  Next Appointment: Has appointment in place.  Sandrea Hughs, NP

## 2022-11-06 LAB — BASIC METABOLIC PANEL
BUN/Creatinine Ratio: 15 (calc) (ref 6–22)
BUN: 43 mg/dL — ABNORMAL HIGH (ref 7–25)
CO2: 18 mmol/L — ABNORMAL LOW (ref 20–32)
Calcium: 9.8 mg/dL (ref 8.6–10.3)
Chloride: 108 mmol/L (ref 98–110)
Creat: 2.95 mg/dL — ABNORMAL HIGH (ref 0.60–1.29)
Glucose, Bld: 134 mg/dL (ref 65–139)
Potassium: 5 mmol/L (ref 3.5–5.3)
Sodium: 136 mmol/L (ref 135–146)

## 2022-11-06 LAB — T4, FREE: Free T4: 0.9 ng/dL (ref 0.8–1.8)

## 2022-11-06 LAB — T3, FREE: T3, Free: 2.9 pg/mL (ref 2.3–4.2)

## 2022-11-19 DIAGNOSIS — E785 Hyperlipidemia, unspecified: Secondary | ICD-10-CM | POA: Diagnosis not present

## 2022-11-19 DIAGNOSIS — R809 Proteinuria, unspecified: Secondary | ICD-10-CM | POA: Diagnosis not present

## 2022-11-19 DIAGNOSIS — I129 Hypertensive chronic kidney disease with stage 1 through stage 4 chronic kidney disease, or unspecified chronic kidney disease: Secondary | ICD-10-CM | POA: Diagnosis not present

## 2022-11-19 DIAGNOSIS — N184 Chronic kidney disease, stage 4 (severe): Secondary | ICD-10-CM | POA: Diagnosis not present

## 2022-11-19 LAB — HIV ANTIBODY (ROUTINE TESTING W REFLEX): HIV 1&2 Ab, 4th Generation: NONREACTIVE

## 2022-11-19 LAB — HEPATITIS B SURFACE ANTIGEN: Hepatitis B Surface Ag: REACTIVE

## 2022-11-22 ENCOUNTER — Other Ambulatory Visit: Payer: Self-pay | Admitting: Internal Medicine

## 2022-11-22 DIAGNOSIS — N184 Chronic kidney disease, stage 4 (severe): Secondary | ICD-10-CM

## 2022-12-12 ENCOUNTER — Other Ambulatory Visit: Payer: Commercial Managed Care - HMO

## 2022-12-17 ENCOUNTER — Other Ambulatory Visit: Payer: Self-pay

## 2023-01-03 ENCOUNTER — Other Ambulatory Visit: Payer: Self-pay

## 2023-01-03 ENCOUNTER — Emergency Department (HOSPITAL_COMMUNITY): Payer: 59

## 2023-01-03 ENCOUNTER — Emergency Department (HOSPITAL_COMMUNITY)
Admission: EM | Admit: 2023-01-03 | Discharge: 2023-01-03 | Disposition: A | Discharge status: Home / Self Care | Payer: 59 | Attending: Emergency Medicine | Admitting: Emergency Medicine

## 2023-01-03 DIAGNOSIS — Z79899 Other long term (current) drug therapy: Secondary | ICD-10-CM | POA: Diagnosis not present

## 2023-01-03 DIAGNOSIS — R0789 Other chest pain: Secondary | ICD-10-CM | POA: Diagnosis not present

## 2023-01-03 DIAGNOSIS — I10 Essential (primary) hypertension: Secondary | ICD-10-CM | POA: Insufficient documentation

## 2023-01-03 DIAGNOSIS — Y92410 Unspecified street and highway as the place of occurrence of the external cause: Secondary | ICD-10-CM | POA: Diagnosis not present

## 2023-01-03 DIAGNOSIS — R079 Chest pain, unspecified: Secondary | ICD-10-CM | POA: Diagnosis not present

## 2023-01-03 MED ORDER — METHOCARBAMOL 500 MG PO TABS: 500.0000 mg | ORAL_TABLET | Freq: Two times a day (BID) | ORAL | 0 refills | Status: AC

## 2023-01-03 MED ORDER — NAPROXEN 375 MG PO TABS: 375.0000 mg | ORAL_TABLET | Freq: Two times a day (BID) | ORAL | 0 refills | Status: AC

## 2023-01-03 MED ORDER — METHOCARBAMOL 500 MG PO TABS
750.0000 mg | ORAL_TABLET | Freq: Once | ORAL | Status: AC
  Administered 2023-01-03: 750 mg via ORAL
  Filled 2023-01-03: qty 2
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
## 2023-01-03 NOTE — Discharge Instructions (Addendum)
I have prescribed muscle relaxers for your pain, please do not drink or drive while taking this medications as it can make you drowsy.   You are also prescribed anti-inflammatories, please take 1 tablet twice a day with food for the next 7 days.  In addition, you may apply ice or heat to your chest to help with pain.
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
## 2023-01-14 DIAGNOSIS — Z7984 Long term (current) use of oral hypoglycemic drugs: Secondary | ICD-10-CM | POA: Diagnosis not present

## 2023-01-14 DIAGNOSIS — E785 Hyperlipidemia, unspecified: Secondary | ICD-10-CM | POA: Diagnosis not present

## 2023-01-14 DIAGNOSIS — Z5948 Other specified lack of adequate food: Secondary | ICD-10-CM | POA: Diagnosis not present

## 2023-01-14 DIAGNOSIS — J45909 Unspecified asthma, uncomplicated: Secondary | ICD-10-CM | POA: Diagnosis not present

## 2023-01-14 DIAGNOSIS — E876 Hypokalemia: Secondary | ICD-10-CM | POA: Diagnosis not present

## 2023-01-14 DIAGNOSIS — I509 Heart failure, unspecified: Secondary | ICD-10-CM | POA: Diagnosis not present

## 2023-01-14 DIAGNOSIS — F419 Anxiety disorder, unspecified: Secondary | ICD-10-CM | POA: Diagnosis not present

## 2023-01-14 DIAGNOSIS — E1165 Type 2 diabetes mellitus with hyperglycemia: Secondary | ICD-10-CM | POA: Diagnosis not present

## 2023-01-14 DIAGNOSIS — N184 Chronic kidney disease, stage 4 (severe): Secondary | ICD-10-CM | POA: Diagnosis not present

## 2023-01-14 DIAGNOSIS — E1122 Type 2 diabetes mellitus with diabetic chronic kidney disease: Secondary | ICD-10-CM | POA: Diagnosis not present

## 2023-01-14 DIAGNOSIS — I130 Hypertensive heart and chronic kidney disease with heart failure and stage 1 through stage 4 chronic kidney disease, or unspecified chronic kidney disease: Secondary | ICD-10-CM | POA: Diagnosis not present

## 2023-01-14 DIAGNOSIS — G3184 Mild cognitive impairment, so stated: Secondary | ICD-10-CM | POA: Diagnosis not present

## 2023-01-24 ENCOUNTER — Other Ambulatory Visit: Payer: Self-pay

## 2023-01-24 NOTE — Telephone Encounter (Signed)
error 

## 2023-02-07 ENCOUNTER — Other Ambulatory Visit: Payer: Self-pay

## 2023-02-07 DIAGNOSIS — E876 Hypokalemia: Secondary | ICD-10-CM

## 2023-02-07 DIAGNOSIS — K219 Gastro-esophageal reflux disease without esophagitis: Secondary | ICD-10-CM

## 2023-02-07 MED ORDER — POTASSIUM CHLORIDE CRYS ER 20 MEQ PO TBCR: 20.0000 meq | EXTENDED_RELEASE_TABLET | Freq: Every day | ORAL | 1 refills | Status: DC

## 2023-02-07 MED ORDER — BUSPIRONE HCL 15 MG PO TABS: 15.0000 mg | ORAL_TABLET | Freq: Two times a day (BID) | ORAL | 1 refills | Status: DC

## 2023-02-07 MED ORDER — OMEPRAZOLE 20 MG PO CPDR
20.0000 mg | DELAYED_RELEASE_CAPSULE | Freq: Every day | ORAL | 1 refills | Status: DC | PRN
Start: 2023-02-07 — End: 2024-02-26

## 2023-03-25 ENCOUNTER — Ambulatory Visit (INDEPENDENT_AMBULATORY_CARE_PROVIDER_SITE_OTHER): Payer: 59 | Admitting: Family

## 2023-03-25 VITALS — BP 118/82 | HR 59 | Temp 97.5°F | Resp 18 | Ht 69.0 in | Wt 178.2 lb

## 2023-03-25 DIAGNOSIS — Z113 Encounter for screening for infections with a predominantly sexual mode of transmission: Secondary | ICD-10-CM | POA: Diagnosis not present

## 2023-03-25 DIAGNOSIS — I129 Hypertensive chronic kidney disease with stage 1 through stage 4 chronic kidney disease, or unspecified chronic kidney disease: Secondary | ICD-10-CM | POA: Diagnosis not present

## 2023-03-25 DIAGNOSIS — K219 Gastro-esophageal reflux disease without esophagitis: Secondary | ICD-10-CM | POA: Diagnosis not present

## 2023-03-25 DIAGNOSIS — F411 Generalized anxiety disorder: Secondary | ICD-10-CM

## 2023-03-25 DIAGNOSIS — E1169 Type 2 diabetes mellitus with other specified complication: Secondary | ICD-10-CM

## 2023-03-25 DIAGNOSIS — E785 Hyperlipidemia, unspecified: Secondary | ICD-10-CM | POA: Diagnosis not present

## 2023-03-25 DIAGNOSIS — N183 Chronic kidney disease, stage 3 unspecified: Secondary | ICD-10-CM

## 2023-03-25 DIAGNOSIS — N184 Chronic kidney disease, stage 4 (severe): Secondary | ICD-10-CM | POA: Diagnosis not present

## 2023-03-25 LAB — CBC WITH DIFFERENTIAL/PLATELET
Absolute Monocytes: 1216 cells/uL — ABNORMAL HIGH (ref 200–950)
Basophils Relative: 0.7 %
Eosinophils Relative: 2.7 %
MCHC: 33.6 g/dL (ref 32.0–36.0)
MCV: 87.5 fL (ref 80.0–100.0)
Neutro Abs: 7923 cells/uL — ABNORMAL HIGH (ref 1500–7800)
RDW: 13.3 % (ref 11.0–15.0)
WBC: 12.8 10*3/uL — ABNORMAL HIGH (ref 3.8–10.8)

## 2023-03-25 NOTE — Progress Notes (Signed)
Provider: Richarda Blade FNP-C   Sheyna Pettibone, Donalee Citrin, NP  Patient Care Team: Leshea Jaggers, Donalee Citrin, NP as PCP - General (Family Medicine)  Extended Emergency Contact Information Primary Emergency Contact: Kenner,Benjamin Mobile Phone: [phone] Relation: Father Secondary Emergency Contact: bell,brandon Mobile Phone: [phone] Relation: Spouse  Code Status:  Full Code  Goals of care: Advanced Directive information    11/05/2022   10:57 AM  Advanced Directives  Does Patient Have a Medical Advance Directive? No  Would patient like information on creating a medical advance directive? No - Patient declined     Chief Complaint  Patient presents with   Medical Management of Chronic Issues    Patient is here for a 48M F/U for chronic conditions with labs    HPI:  Pt is a 42 y.o. male seen today for 3 months follow up medical management of chronic diseases.   He does not check CBG at home states could not afford the glucometer.  Has changed diet eats brown rice,chicken and veggies. Walks three times per week for 4 miles daily. Will be starting cardio exercise at home. Wait stable compared to previous visit.   Chronic Kidney disease - He was seen by Nephrologist but states was unable to follow up due to cost $ 100 per visit.  Immunization up to date except COVID-19 vaccine.   Past Medical History:  Diagnosis Date   Anxiety 01/04/2020   Elevated lipids 11/2019   Elevated serum creatinine 11/2019   GERD (gastroesophageal reflux disease)    Hypertension    Hypertensive crisis 11/2019   Hypokalemia 11/2019   Vitamin D deficiency 11/2019   History reviewed. No pertinent surgical history.  No Known Allergies  Allergies as of 03/25/2023   No Known Allergies      Medication List        Accurate as of March 25, 2023  3:04 PM. If you have any questions, ask your nurse or doctor.          Accu-Chek Aviva Plus test strip Generic drug: glucose blood Use to test blood  sugar twice daily. Dx: E11.69   Accu-Chek Aviva Plus w/Device Kit Use to test blood sugar twice daily. Dx: E11.69   Accu-Chek Softclix Lancets lancets Use to test blood sugar twice daily. Dx: E11.69   albuterol 108 (90 Base) MCG/ACT inhaler Commonly known as: VENTOLIN HFA INHALE 1-2 PUFFS BY MOUTH EVERY 6 HOURS AS NEEDED FOR WHEEZE OR SHORTNESS OF BREATH   amLODipine 10 MG tablet Commonly known as: NORVASC TAKE 1 TABLET(10 MG) BY MOUTH DAILY   atorvastatin 40 MG tablet Commonly known as: LIPITOR Take 1 tablet (40 mg total) by mouth daily.   busPIRone 15 MG tablet Commonly known as: BUSPAR Take 1 tablet (15 mg total) by mouth 2 (two) times daily.   carvedilol 6.25 MG tablet Commonly known as: COREG TAKE 1 TABLET(6.25 MG) BY MOUTH TWICE DAILY WITH A MEAL   cetirizine 10 MG tablet Commonly known as: ZYRTEC Take 1 tablet (10 mg total) by mouth daily.   cloNIDine 0.1 MG tablet Commonly known as: CATAPRES Take 1 tablet (0.1 mg total) by mouth 3 (three) times daily.   Debrox 6.5 % OTIC solution Generic drug: carbamide peroxide Place 5 drops into both ears 2 (two) times daily.   diphenhydrAMINE 25 MG tablet Commonly known as: BENADRYL Take 25 mg by mouth every 8 (eight) hours as needed for itching or allergies.   glimepiride 1 MG tablet Commonly known as: AMARYL Take 1  tablet (1 mg total) by mouth daily with breakfast.   hydrOXYzine 10 MG tablet Commonly known as: ATARAX Take 1 tablet (10 mg total) by mouth 3 (three) times daily as needed for anxiety.   ibuprofen 600 MG tablet Commonly known as: ADVIL TAKE 1 TABLET (600 MG TOTAL) BY MOUTH EVERY 8 (EIGHT) HOURS AS NEEDED.   lisinopril 30 MG tablet Commonly known as: ZESTRIL TAKE 1 TABLET(30 MG) BY MOUTH DAILY   omega-3 acid ethyl esters 1 g capsule Commonly known as: LOVAZA TAKE 2 CAPSULES BY MOUTH DAILY   omeprazole 20 MG capsule Commonly known as: PRILOSEC Take 1 capsule (20 mg total) by mouth daily as  needed (acid reflux).   potassium chloride SA 20 MEQ tablet Commonly known as: Klor-Con M20 Take 1 tablet (20 mEq total) by mouth daily.   Vitamin D (Ergocalciferol) 1.25 MG (50000 UNIT) Caps capsule Commonly known as: DRISDOL Take 1 capsule (50,000 Units total) by mouth every 7 (seven) days.        Review of Systems  Constitutional:  Negative for appetite change, chills, fatigue, fever and unexpected weight change.  HENT:  Negative for congestion, dental problem, ear discharge, ear pain, facial swelling, hearing loss, nosebleeds, postnasal drip, rhinorrhea, sinus pressure, sinus pain, sneezing, sore throat, tinnitus and trouble swallowing.   Eyes:  Negative for pain, discharge, redness, itching and visual disturbance.  Respiratory:  Negative for cough, chest tightness, shortness of breath and wheezing.   Cardiovascular:  Negative for chest pain, palpitations and leg swelling.  Gastrointestinal:  Negative for abdominal distention, abdominal pain, blood in stool, constipation, diarrhea, nausea and vomiting.  Endocrine: Negative for cold intolerance, heat intolerance, polydipsia, polyphagia and polyuria.  Genitourinary:  Negative for difficulty urinating, dysuria, flank pain, frequency and urgency.  Musculoskeletal:  Negative for arthralgias, back pain, gait problem, joint swelling, myalgias, neck pain and neck stiffness.  Skin:  Negative for color change, pallor, rash and wound.  Neurological:  Negative for dizziness, syncope, speech difficulty, weakness, light-headedness, numbness and headaches.  Hematological:  Does not bruise/bleed easily.  Psychiatric/Behavioral:  Positive for agitation. Negative for behavioral problems, confusion, hallucinations, self-injury, sleep disturbance and suicidal ideas. The patient is nervous/anxious.        Anxiety medication effective     Immunization History  Administered Date(s) Administered   Influenza,inj,Quad PF,6+ Mos 10/15/2022   PFIZER(Purple  Top)SARS-COV-2 Vaccination 11/26/2019, 12/28/2019   Tdap 10/15/2022   Pertinent  Health Maintenance Due  Topic Date Due   INFLUENZA VACCINE  04/04/2023      09/03/2021   11:34 AM 10/23/2021    9:15 AM 11/20/2021    9:04 AM 10/15/2022    8:49 AM 11/05/2022   10:56 AM  Fall Risk  Falls in the past year?  0 0 0 0  Was there an injury with Fall?   0 0 1  Fall Risk Category Calculator   0 0 1  Fall Risk Category (Retired)   Low    (RETIRED) Patient Fall Risk Level Low fall risk  Low fall risk    Patient at Risk for Falls Due to   No Fall Risks No Fall Risks   Fall risk Follow up   Falls evaluation completed Falls evaluation completed    Functional Status Survey:    Vitals:   03/25/23 1424  BP: 118/82  Pulse: (!) 59  Resp: 18  Temp: (!) 97.5 F (36.4 C)  SpO2: 95%  Weight: 178 lb 3.2 oz (80.8 kg)  Height: 5\' 9"  (  1.753 m)   Body mass index is 26.32 kg/m. Physical Exam Vitals reviewed.  Constitutional:      General: He is not in acute distress.    Appearance: Normal appearance. He is overweight. He is not ill-appearing or diaphoretic.  HENT:     Head: Normocephalic.     Right Ear: Tympanic membrane, ear canal and external ear normal. There is no impacted cerumen.     Left Ear: Tympanic membrane, ear canal and external ear normal. There is no impacted cerumen.     Nose: Nose normal. No congestion or rhinorrhea.     Mouth/Throat:     Mouth: Mucous membranes are moist.     Pharynx: Oropharynx is clear. No oropharyngeal exudate or posterior oropharyngeal erythema.  Eyes:     General: No scleral icterus.       Right eye: No discharge.        Left eye: No discharge.     Extraocular Movements: Extraocular movements intact.     Conjunctiva/sclera: Conjunctivae normal.     Pupils: Pupils are equal, round, and reactive to light.  Neck:     Vascular: No carotid bruit.  Cardiovascular:     Rate and Rhythm: Normal rate and regular rhythm.     Pulses: Normal pulses.     Heart  sounds: Normal heart sounds. No murmur heard.    No friction rub. No gallop.  Pulmonary:     Effort: Pulmonary effort is normal. No respiratory distress.     Breath sounds: Normal breath sounds. No wheezing, rhonchi or rales.  Chest:     Chest wall: No tenderness.  Abdominal:     General: Bowel sounds are normal. There is no distension.     Palpations: Abdomen is soft. There is no mass.     Tenderness: There is no abdominal tenderness. There is no right CVA tenderness, left CVA tenderness, guarding or rebound.  Musculoskeletal:        General: No swelling or tenderness. Normal range of motion.     Cervical back: Normal range of motion. No rigidity or tenderness.     Right lower leg: No edema.     Left lower leg: No edema.  Lymphadenopathy:     Cervical: No cervical adenopathy.  Skin:    General: Skin is warm and dry.     Coloration: Skin is not pale.     Findings: No bruising, erythema, lesion or rash.  Neurological:     Mental Status: He is alert and oriented to person, place, and time.     Cranial Nerves: No cranial nerve deficit.     Sensory: No sensory deficit.     Motor: No weakness.     Coordination: Coordination normal.     Gait: Gait normal.  Psychiatric:        Mood and Affect: Mood normal.        Speech: Speech normal.        Behavior: Behavior normal.        Thought Content: Thought content normal.        Judgment: Judgment normal.     Labs reviewed: Recent Labs    10/15/22 0957 11/05/22 1135  NA 138 136  K 3.3* 5.0  CL 100 108  CO2 26 18*  GLUCOSE 116* 134  BUN 34* 43*  CREATININE 2.74* 2.95*  CALCIUM 9.8 9.8   Recent Labs    10/15/22 0957  AST 35  ALT 23  BILITOT 0.4  PROT 6.9  Recent Labs    10/15/22 0957  WBC 13.2*  NEUTROABS 8,224*  HGB 16.8  HCT 48.1  MCV 82.8  PLT 356   Lab Results  Component Value Date   TSH 0.33 (L) 10/15/2022   Lab Results  Component Value Date   HGBA1C 7.2 (H) 10/15/2022   Lab Results  Component  Value Date   CHOL 170 10/15/2022   HDL 46 10/15/2022   LDLCALC 95 10/15/2022   TRIG 196 (H) 10/15/2022   CHOLHDL 3.7 10/15/2022    Significant Diagnostic Results in last 30 days:  No results found.  Assessment/Plan 1. Type 2 diabetes mellitus with hyperlipidemia (HCC) Lab Results  Component Value Date   HGBA1C 7.2 (H) 10/15/2022   No home CBG for review -Continue on glimepiride -Continue on statin -On ACE inhibitor for renal protection - Lipid panel - TSH - Hemoglobin A1c  2. Stage 4 chronic kidney disease (HCC) Creatinine at baseline -Continue to follow-up with a nephrologist -Continue to avoid nephrotoxins and dose medication for renal  - COMPLETE METABOLIC PANEL WITH GFR  3. Benign hypertension with chronic kidney disease, stage III (HCC) Blood pressure well-controlled -Continue on lisinopril, clonidine, carvedilol and amlodipine -Continue with lifestyle modification - COMPLETE METABOLIC PANEL WITH GFR - CBC with Differential/Platelet  4. Gastroesophageal reflux disease, unspecified whether esophagitis present Symptoms controlled. H/H stable.No tarry or black stool  - advised to avoid eating meals late in the evening and to avoid aggravating foods and spices. - continue on Omeprazole PRN   - CBC with Differential/Platelet  5. Generalized anxiety disorder Mood stable  -Continue on hydroxyzine 3 times daily as needed - TSH  6. Screen for STD (sexually transmitted disease) Request STD screening - Chlamydia/Neisseria Gonorrhoeae RNA,TMA,Urogenital - HIV Antibody (routine testing w rflx)  Family/ staff Communication: Reviewed plan of care with patient  Labs/tests ordered:  - COMPLETE METABOLIC PANEL WITH GFR - CBC with Differential/Platelet - Lipid panel - TSH - Hemoglobin A1c - Chlamydia/Neisseria Gonorrhoeae RNA,TMA,Urogenital - HIV Antibody (routine testing w rflx)  Next Appointment : Return in about 6 months (around 09/25/2023) for medical mangement  of chronic issues.Caesar Bookman, NP

## 2023-03-26 LAB — COMPLETE METABOLIC PANEL WITH GFR
AG Ratio: 1.4 (calc) (ref 1.0–2.5)
ALT: 13 U/L (ref 9–46)
Alkaline phosphatase (APISO): 89 U/L (ref 36–130)
BUN/Creatinine Ratio: 12 (calc) (ref 6–22)
CO2: 22 mmol/L (ref 20–32)
Calcium: 10 mg/dL (ref 8.6–10.3)
Chloride: 106 mmol/L (ref 98–110)
Creat: 3.5 mg/dL — ABNORMAL HIGH (ref 0.60–1.29)
Glucose, Bld: 100 mg/dL (ref 65–139)
Sodium: 137 mmol/L (ref 135–146)
Total Bilirubin: 0.3 mg/dL (ref 0.2–1.2)
Total Protein: 7.2 g/dL (ref 6.1–8.1)

## 2023-03-26 LAB — CBC WITH DIFFERENTIAL/PLATELET
Basophils Absolute: 90 cells/uL (ref 0–200)
Hemoglobin: 15.2 g/dL (ref 13.2–17.1)
Lymphs Abs: 3226 cells/uL (ref 850–3900)
MPV: 9.5 fL (ref 7.5–12.5)
RBC: 5.18 10*6/uL (ref 4.20–5.80)

## 2023-03-26 LAB — LIPID PANEL
Cholesterol: 120 mg/dL (ref <200)
HDL: 36 mg/dL — ABNORMAL LOW (ref >=40)
Non-HDL Cholesterol (Calc): 84 mg/dL (calc) (ref <130)
Total CHOL/HDL Ratio: 3.3 (calc) (ref <5.0)
Triglycerides: 291 mg/dL — ABNORMAL HIGH (ref <150)

## 2023-03-26 LAB — TSH: TSH: 0.91 mIU/L (ref 0.40–4.50)

## 2023-03-26 LAB — HEMOGLOBIN A1C
Hgb A1c MFr Bld: 5.7 % of total Hgb — ABNORMAL HIGH (ref <5.7)
Mean Plasma Glucose: 117 mg/dL

## 2023-03-28 LAB — COMPLETE METABOLIC PANEL WITH GFR
AST: 17 U/L (ref 10–40)
Albumin: 4.2 g/dL (ref 3.6–5.1)
BUN: 41 mg/dL — ABNORMAL HIGH (ref 7–25)
Globulin: 3 g/dL (calc) (ref 1.9–3.7)
Potassium: 4.6 mmol/L (ref 3.5–5.3)
eGFR: 22 mL/min/{1.73_m2} — ABNORMAL LOW (ref >=60)

## 2023-03-28 LAB — CBC WITH DIFFERENTIAL/PLATELET
Eosinophils Absolute: 346 cells/uL (ref 15–500)
HCT: 45.3 % (ref 38.5–50.0)
MCH: 29.3 pg (ref 27.0–33.0)
Monocytes Relative: 9.5 %
Neutrophils Relative %: 61.9 %
Platelets: 340 10*3/uL (ref 140–400)
Total Lymphocyte: 25.2 %

## 2023-03-28 LAB — CHLAMYDIA/NEISSERIA GONORRHOEAE RNA,TMA,UROGENTIAL: N. gonorrhoeae RNA, TMA: NOT DETECTED

## 2023-03-28 LAB — HEMOGLOBIN A1C: eAG (mmol/L): 6.5 mmol/L

## 2023-03-28 LAB — LIPID PANEL: LDL Cholesterol (Calc): 50 mg/dL (calc)

## 2023-03-28 LAB — HIV ANTIBODY (ROUTINE TESTING W REFLEX): HIV 1&2 Ab, 4th Generation: NONREACTIVE

## 2023-04-08 ENCOUNTER — Other Ambulatory Visit: Payer: Self-pay | Admitting: Family

## 2023-04-08 DIAGNOSIS — E1169 Type 2 diabetes mellitus with other specified complication: Secondary | ICD-10-CM

## 2023-04-15 ENCOUNTER — Ambulatory Visit: Payer: 59 | Admitting: Family

## 2023-04-30 ENCOUNTER — Other Ambulatory Visit: Payer: Self-pay

## 2023-05-13 ENCOUNTER — Emergency Department (HOSPITAL_COMMUNITY)
Admission: EM | Admit: 2023-05-13 | Discharge: 2023-05-13 | Disposition: A | Discharge status: Home / Self Care | Payer: 59 | Attending: Emergency Medicine | Admitting: Emergency Medicine

## 2023-05-13 ENCOUNTER — Emergency Department (HOSPITAL_COMMUNITY): Payer: 59

## 2023-05-13 ENCOUNTER — Encounter: Payer: Self-pay | Admitting: *Deleted

## 2023-05-13 DIAGNOSIS — R001 Bradycardia, unspecified: Secondary | ICD-10-CM | POA: Diagnosis not present

## 2023-05-13 DIAGNOSIS — T447X5A Adverse effect of beta-adrenoreceptor antagonists, initial encounter: Secondary | ICD-10-CM | POA: Diagnosis not present

## 2023-05-13 DIAGNOSIS — R29818 Other symptoms and signs involving the nervous system: Secondary | ICD-10-CM | POA: Diagnosis not present

## 2023-05-13 DIAGNOSIS — D72829 Elevated white blood cell count, unspecified: Secondary | ICD-10-CM | POA: Diagnosis not present

## 2023-05-13 DIAGNOSIS — R42 Dizziness and giddiness: Secondary | ICD-10-CM | POA: Diagnosis not present

## 2023-05-13 DIAGNOSIS — M4802 Spinal stenosis, cervical region: Secondary | ICD-10-CM | POA: Diagnosis not present

## 2023-05-13 DIAGNOSIS — Z79899 Other long term (current) drug therapy: Secondary | ICD-10-CM | POA: Insufficient documentation

## 2023-05-13 DIAGNOSIS — R55 Syncope and collapse: Secondary | ICD-10-CM | POA: Insufficient documentation

## 2023-05-13 DIAGNOSIS — I10 Essential (primary) hypertension: Secondary | ICD-10-CM | POA: Insufficient documentation

## 2023-05-13 DIAGNOSIS — M50121 Cervical disc disorder at C4-C5 level with radiculopathy: Secondary | ICD-10-CM | POA: Diagnosis not present

## 2023-05-13 DIAGNOSIS — M4722 Other spondylosis with radiculopathy, cervical region: Secondary | ICD-10-CM | POA: Diagnosis not present

## 2023-05-13 LAB — COMPREHENSIVE METABOLIC PANEL
ALT: 13 U/L (ref 0–44)
AST: 18 U/L (ref 15–41)
Albumin: 3.5 g/dL (ref 3.5–5.0)
Alkaline Phosphatase: 83 U/L (ref 38–126)
Anion gap: 7 (ref 5–15)
BUN: 33 mg/dL — ABNORMAL HIGH (ref 6–20)
CO2: 18 mmol/L — ABNORMAL LOW (ref 22–32)
Calcium: 8.9 mg/dL (ref 8.9–10.3)
Chloride: 108 mmol/L (ref 98–111)
Creatinine, Ser: 3.28 mg/dL — ABNORMAL HIGH (ref 0.61–1.24)
GFR, Estimated: 23 mL/min — ABNORMAL LOW (ref >60)
Glucose, Bld: 185 mg/dL — ABNORMAL HIGH (ref 70–99)
Potassium: 5 mmol/L (ref 3.5–5.1)
Sodium: 133 mmol/L — ABNORMAL LOW (ref 135–145)
Total Bilirubin: 0.5 mg/dL (ref 0.3–1.2)
Total Protein: 6.7 g/dL (ref 6.5–8.1)

## 2023-05-13 LAB — TROPONIN I (HIGH SENSITIVITY)
Troponin I (High Sensitivity): 8 ng/L (ref <18)
Troponin I (High Sensitivity): 6 ng/L (ref <18)

## 2023-05-13 LAB — CBC WITH DIFFERENTIAL/PLATELET
Abs Immature Granulocytes: 0.08 10*3/uL — ABNORMAL HIGH (ref 0.00–0.07)
Basophils Absolute: 0.1 10*3/uL (ref 0.0–0.1)
Basophils Relative: 0 %
Eosinophils Absolute: 0.4 10*3/uL (ref 0.0–0.5)
Eosinophils Relative: 2 %
HCT: 47.7 % (ref 39.0–52.0)
Hemoglobin: 14.9 g/dL (ref 13.0–17.0)
Immature Granulocytes: 0 %
Lymphocytes Relative: 6 %
Lymphs Abs: 1.1 10*3/uL (ref 0.7–4.0)
MCH: 28.7 pg (ref 26.0–34.0)
MCHC: 31.2 g/dL (ref 30.0–36.0)
MCV: 91.7 fL (ref 80.0–100.0)
Monocytes Absolute: 1.4 10*3/uL — ABNORMAL HIGH (ref 0.1–1.0)
Monocytes Relative: 8 %
Neutro Abs: 15.8 10*3/uL — ABNORMAL HIGH (ref 1.7–7.7)
Neutrophils Relative %: 84 %
Platelets: 272 10*3/uL (ref 150–400)
RBC: 5.2 MIL/uL (ref 4.22–5.81)
RDW: 13.2 % (ref 11.5–15.5)
WBC: 18.8 10*3/uL — ABNORMAL HIGH (ref 4.0–10.5)
nRBC: 0 % (ref 0.0–0.2)

## 2023-05-13 LAB — BASIC METABOLIC PANEL: Glucose: 156

## 2023-05-13 LAB — BRAIN NATRIURETIC PEPTIDE: B Natriuretic Peptide: 8 pg/mL (ref 0.0–100.0)

## 2023-05-13 LAB — D-DIMER, QUANTITATIVE: D-Dimer, Quant: 0.55 ug{FEU}/mL — ABNORMAL HIGH (ref 0.00–0.50)

## 2023-05-13 MED ORDER — LACTATED RINGERS IV BOLUS
1000.0000 mL | Freq: Once | INTRAVENOUS | Status: AC
  Administered 2023-05-13: 1000 mL via INTRAVENOUS

## 2023-05-13 MED ORDER — METHYLPREDNISOLONE 4 MG PO TBPK: ORAL_TABLET | ORAL | 0 refills | Status: DC

## 2023-05-13 NOTE — Congregational Nurse Program (Signed)
  Dept: [phone]   Congregational Nurse Program Note  Date of Encounter: 05/13/2023  Past Medical History: Past Medical History:  Diagnosis Date   Anxiety 01/04/2020   Elevated lipids 11/2019   Elevated serum creatinine 11/2019   GERD (gastroesophageal reflux disease)    Hypertension    Hypertensive crisis 11/2019   Hypokalemia 11/2019   Vitamin D deficiency 11/2019    Encounter Details:  CNP Questionnaire - 05/13/23 1237       Questionnaire   Ask client: Do you give verbal consent for me to treat you today? Yes    Student Assistance N/A    Location Patient Served  [facility]    Visit Setting with Client Organization    Patient Status Unknown    Insurance Unknown    Insurance/Financial Assistance Referral N/A    Medication N/A    Medical Provider Yes    Screening Referrals Made N/A    Medical Referrals Made N/A    Medical Appointment Made N/A    Recently w/o PCP, now 1st time PCP visit completed due to CNs referral or appointment made N/A    Food N/A    Transportation N/A    Housing/Utilities N/A    Interpersonal Safety N/A    Interventions Advocate/Support    Abnormal to Normal Screening Since Last CN Visit N/A    Screenings CN Performed Blood Pressure;Blood Glucose    Sent Client to Lab for: N/A    Did client attend any of the following based off CNs referral or appointments made? N/A    ED Visit Averted N/A    Life-Saving Intervention Made N/A           Writer was alerted to come to food pantry as someone had passed out and vomited requesting help. Client was sitting in chair, drinking water, alert and oriented. He reported he takes metformin and blood pressure medications. Someone from shelter had already called 911. Checked vitals. Blood pressure 91/69, pulse (!) 58. CBG 156. EMS arrived and took over assisting client. Jalexia Lalli W RN CN

## 2023-05-13 NOTE — ED Provider Notes (Signed)
[facility] AT [facility] Provider Note   CSN: [id number] Arrival date & time: 05/13/23  1308     History  No chief complaint on file.   Jeremiah [name] is [name] 42 y.o. male.  HPI   42 year old male with medical history significant for [name], Jeremiah [name], Jeremiah [name], Jeremiah [name], Jeremiah [name] syncopal event.  The patient states that he thinks he may have taken an extra dose of his home carvedilol today.  He took all of his blood pressure medicines today.  She was standing in line at [name] and subcu became diaphoretic, lightheaded.  He passed out and the person behind him in line caught him.  No clear head trauma or loss of consciousness.  His initial systolic blood pressure was 90/70 and his heart rate was 40.  EMS arrived and gave the patient 2 mg  IV glucagon.  With of IV glucagon with improvement in his heart rate.  The patient states that he takes amlodipine, Coreg, lisinopril.  He states that he normally doubles up on his Coreg in the morning rather than taking it twice daily.  He thinks he may have taken an extra double dose today.  Currently arrives GCS 15, ABC intact, hemodynamically stable, mildly bradycardic on cardiac telemetry.  He denies any chest pain, shortness of breath.  On further evaluation, the patient does state that he has had intermittent right arm and right leg numbness over the past several months.  No falls or trauma to the neck or head that he can think of.  No current focal numbness or weakness.  Home Medications Prior to Admission medications   Medication Sig Start Date End Date Taking? Authorizing Provider  Accu-Chek Softclix Lancets lancets Use to test blood sugar twice daily. Dx: E11.69 11/05/22   Ngetich, Dinah C, NP  albuterol (VENTOLIN HFA) 108 (90 Base) MCG/ACT inhaler INHALE 1-2 PUFFS BY MOUTH EVERY 6 HOURS AS NEEDED FOR WHEEZE OR SHORTNESS OF BREATH 10/16/22   Ngetich, Dinah C, NP  amLODipine  (NORVASC) 10 MG tablet TAKE 1 TABLET(10 MG) BY MOUTH DAILY 10/16/22   Ngetich, Dinah C, NP  atorvastatin (LIPITOR) 40 MG tablet Take 1 tablet (40 mg total) by mouth daily. 10/16/22   Ngetich, Dinah C, NP  Blood Glucose Monitoring Suppl (ACCU-CHEK AVIVA PLUS) w/Device KIT Use to test blood sugar twice daily. Dx: E11.69 11/05/22   Ngetich, Dinah C, NP  busPIRone (BUSPAR) 15 MG tablet Take 1 tablet (15 mg total) by mouth 2 (two) times daily. 02/07/23   Ngetich, Dinah C, NP  carbamide peroxide (DEBROX) 6.5 % OTIC solution Place 5 drops into both ears 2 (two) times daily. 10/29/22   Ngetich, Dinah C, NP  carvedilol (COREG) 6.25 MG tablet TAKE 1 TABLET(6.25 MG) BY MOUTH TWICE DAILY WITH [name] [employer] 10/16/22   Ngetich, Dinah C, NP  cetirizine (ZYRTEC) 10 MG tablet Take 1 tablet (10 mg total) by mouth daily. 11/21/21   Passmore, Enid Derry I, NP  cloNIDine (CATAPRES) 0.1 MG tablet Take 1 tablet (0.1 mg total) by mouth 3 (three) times daily. 10/16/22   Ngetich, Dinah C, NP  diphenhydrAMINE (BENADRYL) 25 MG tablet Take 25 mg by mouth every 8 (eight) hours as needed for itching or allergies.     [provider]  glimepiride (AMARYL) 1 MG tablet TAKE 1 TABLET BY MOUTH DAILY WITH BREAKFAST. 04/09/23   Ngetich, Dinah C, NP  glucose blood (ACCU-CHEK AVIVA PLUS) test strip Use  to test blood sugar twice daily. Dx: E11.69 11/05/22   Ngetich, Donalee Citrin, NP  hydrOXYzine (ATARAX) 10 MG tablet Take 1 tablet (10 mg total) by mouth 3 (three) times daily as needed for Jeremiah [name]. 10/16/22   Ngetich, Dinah C, NP  ibuprofen (ADVIL) 600 MG tablet TAKE 1 TABLET (600 MG TOTAL) BY MOUTH EVERY 8 (EIGHT) HOURS AS NEEDED. 09/20/20   Kallie Locks, FNP  lisinopril (ZESTRIL) 30 MG tablet TAKE 1 TABLET(30 MG) BY MOUTH DAILY 10/16/22   Ngetich, Dinah C, NP  omega-3 acid ethyl esters (LOVAZA) 1 g capsule TAKE 2 CAPSULES BY MOUTH DAILY 09/05/22   Ivonne Andrew, NP  omeprazole (PRILOSEC) 20 MG capsule Take 1 capsule (20 mg total) by mouth daily as needed  (acid reflux). 02/07/23 02/07/24  Ngetich, Dinah C, NP  potassium chloride SA (KLOR-CON M20) 20 MEQ tablet Take 1 tablet (20 mEq total) by mouth daily. 02/07/23   Ngetich, Dinah C, NP  Jeremiah D, Ergocalciferol, (DRISDOL) 1.25 MG (50000 UNIT) CAPS capsule Take 1 capsule (50,000 Units total) by mouth every 7 (seven) days. 10/16/22   Ngetich, Donalee Citrin, NP      Allergies    Patient has no known allergies.    Review of Systems   Review of Systems  All other systems reviewed and are negative.   Physical Exam Updated Vital Signs BP 113/72   Pulse (!) 54   Temp 98.5 F (36.9 C) (Oral)   Resp 15   SpO2 96%  Physical Exam Vitals and nursing note reviewed.  Constitutional:      General: He is not in acute distress.    Appearance: He is well-developed.  HENT:     Head: Normocephalic and atraumatic.  Eyes:     Conjunctiva/sclera: Conjunctivae normal.  Cardiovascular:     Rate and Rhythm: Regular rhythm. Bradycardia present.     Pulses: Normal pulses.  Pulmonary:     Effort: Pulmonary effort is normal. No respiratory distress.     Breath sounds: Normal breath sounds.  Abdominal:     Palpations: Abdomen is soft.     Tenderness: There is no abdominal tenderness.  Musculoskeletal:        General: No swelling.     Cervical back: Neck supple.  Skin:    General: Skin is warm and dry.     Capillary Refill: Capillary refill takes less than 2 seconds.  Neurological:     Mental Status: He is alert.     Comments: MENTAL STATUS EXAM:    Orientation: Alert and oriented to person, place and time.  Memory: Cooperative, follows commands well.  Language: Speech is clear and language is normal.   CRANIAL NERVES:    CN 2 (Optic): Visual fields intact to confrontation.  CN 3,4,6 (EOM): Pupils equal and reactive to light. Full extraocular eye movement without nystagmus.  CN 5 (Trigeminal): Facial sensation is normal, no weakness of masticatory muscles.  CN 7 (Facial): No facial weakness or asymmetry.   CN 8 (Auditory): Auditory acuity grossly normal.  CN 9,10 (Glossophar): The uvula is midline, the palate elevates symmetrically.  CN 11 (spinal access): Normal sternocleidomastoid and trapezius strength.  CN 12 (Hypoglossal): The tongue is midline. No atrophy or fasciculations.Marland Kitchen   MOTOR:  Muscle Strength: 5/5RUE, 5/5LUE, 5/5RLE, 5/5LLE.   COORDINATION:   Intact finger-to-nose, no tremor.   SENSATION:   Intact to light touch all four extremities.   Psychiatric:        Mood and Affect: Mood  normal.     ED Results / Procedures / Treatments   Labs (all labs ordered are listed, but only abnormal results are displayed) Labs Reviewed  CBC WITH DIFFERENTIAL/PLATELET - Abnormal; Notable for the following components:      Result Value   WBC 18.8 (*)    Neutro Abs 15.8 (*)    Monocytes Absolute 1.4 (*)    Abs Immature Granulocytes 0.08 (*)    All other components within normal limits  COMPREHENSIVE METABOLIC PANEL - Abnormal; Notable for the following components:   Sodium 133 (*)    CO2 18 (*)    Glucose, Bld 185 (*)    BUN 33 (*)    Creatinine, Ser 3.28 (*)    GFR, Estimated 23 (*)    All other components within normal limits  D-DIMER, QUANTITATIVE - Abnormal; Notable for the following components:   D-Dimer, Quant 0.55 (*)    All other components within normal limits  BRAIN NATRIURETIC PEPTIDE  TROPONIN I (HIGH SENSITIVITY)  TROPONIN I (HIGH SENSITIVITY)    EKG EKG Interpretation Date/Time:  Monday May 13 2023 13:29:11 EDT Ventricular Rate:  57 PR Interval:  193 QRS Duration:  81 QT Interval:  418 QTC Calculation: 407 R Axis:   53  Text Interpretation: Sinus bradycardia ST elev, probable normal early repol pattern Confirmed by Ernie Avena ([id number]) on 05/13/2023 3:52:30 PM  Radiology DG Chest 2 View  Result Date: 05/13/2023 CLINICAL DATA:  Syncope. EXAM: CHEST - 2 VIEW COMPARISON:  01/03/2023 FINDINGS: Normal sized heart. Clear lungs with normal vascularity. Small  bilateral diaphragmatic eventration. Unremarkable bones. IMPRESSION: No active disease. Electronically Signed   By: Beckie Salts M.D.   On: 05/13/2023 14:56    Procedures Procedures    Medications Ordered in ED Medications  lactated ringers bolus 1,000 mL (1,000 mLs Intravenous New Bag/Given 05/13/23 1342)    ED Course/ Medical Decision Making/ [name]&P                                 Medical Decision Making Amount and/or Complexity of Data Reviewed Labs: ordered. Radiology: ordered.   42 year old male with medical history significant for [name], Jeremiah [name], Jeremiah [name], Jeremiah [name], Jeremiah [name] syncopal event.  The patient states that he thinks he may have taken an extra dose of his home carvedilol today.  He took all of his blood pressure medicines today.  She was standing in line at [name] and subcu became diaphoretic, lightheaded.  He passed out and the person behind him in line caught him.  No clear head trauma or loss of consciousness.  His initial systolic blood pressure was 90/70 and his heart rate was 40.  EMS arrived and gave the patient 2 mg  IV glucagon.  With of IV glucagon with improvement in his heart rate.  The patient states that he takes amlodipine, Coreg, lisinopril.  He states that he normally doubles up on his Coreg in the morning rather than taking it twice daily.  He thinks he may have taken an extra double dose today.  Currently arrives GCS 15, ABC intact, hemodynamically stable, mildly bradycardic on cardiac telemetry.  He denies any chest pain, shortness of breath.  On further evaluation, the patient does state that he has had intermittent right arm and right leg numbness over the past several months.  No falls or trauma to the neck or head that  he can think of.  No current focal numbness or weakness.  On arrival, the patient was afebrile, bradycardic heart rate 54, hemodynamically stable, BP 113/72, saturating 96% on room air.  Concern  for mild beta-blocker overdose resulting in syncope.  Additionally considered vasovagal syncope, orthostatic hypotension, dehydration, hypovolemia.  Patient arrived with IV access in place.  EKG initially revealed sinus bradycardia, ventricular rate 56, no evidence of heart block, no acute ischemic changes to indicate STEMI, early repolarization present.  Screening laboratory evaluation revealed initial troponin 8, BNP unremarkable, CMP without significant electrolyte abnormality, mildly low bicarb to 18, without an elevation of anion gap with an anion gap of 7, creatinine elevated but at baseline elevated to 3.28, CBC with [name] nonspecific leukocytosis to 18.8, no anemia.  The patient is afebrile and overall well-appearing.  Initial D-dimer was elevated at 0.55.  The patient was administered IV fluid bolus.  He remained hemodynamically stable in the ER.  Chest x-ray was performed which was unremarkable.  Imaging obtained to include CT of the head and cervical spine given the patient's intermittent complaint of numbness in the right hemibody.  Additionally, patient has an elevated D-dimer, in the setting of syncope, considered PE.  CT angiogram PE ordered.  And at time of signout to reassess the patient given likely mild beta-blocker overdose, follow-up results of CT imaging, ultimate disposition pending reassessment. Signout given to Dr. Posey Rea at 1500.  Final Clinical Impression(s) / ED Diagnoses Final diagnoses:  Syncope, unspecified syncope type  Adverse effect of beta-blocker, initial encounter    Rx / DC Orders ED Discharge Orders     None         Ernie Avena, MD 05/13/23 1554

## 2023-05-13 NOTE — ED Notes (Signed)
Patient transported to X-ray 

## 2023-05-13 NOTE — ED Provider Notes (Addendum)
  Physical Exam  BP 126/81   Pulse (!) 58   Temp 98.5 F (36.9 C) (Oral)   Resp 14   SpO2 95%   Physical Exam Constitutional:      General: He is not in acute distress.    Appearance: Normal appearance.  HENT:     Head: Normocephalic and atraumatic.     Nose: No congestion or rhinorrhea.  Eyes:     General:        Right eye: No discharge.        Left eye: No discharge.     Extraocular Movements: Extraocular movements intact.     Pupils: Pupils are equal, round, and reactive to light.  Cardiovascular:     Rate and Rhythm: Normal rate and regular rhythm.     Heart sounds: No murmur heard. Pulmonary:     Effort: No respiratory distress.     Breath sounds: No wheezing or rales.  Abdominal:     General: There is no distension.     Tenderness: There is no abdominal tenderness.  Musculoskeletal:        General: Normal range of motion.     Cervical back: Normal range of motion.  Skin:    General: Skin is warm and dry.  Neurological:     General: No focal deficit present.     Mental Status: He is alert.     Procedures  Procedures  ED Course / MDM    Medical Decision Making Amount and/or Complexity of Data Reviewed Labs: ordered. Radiology: ordered.  Risk Prescription drug management.   Patient given handout.  Syncopal episode after possibly taking an additional dose of his beta-blocker or lisinopril.  Orthostatic syncopal event at home arriving hypotensive but has been stable after fluid resuscitation here in the emergency department.  Orthostatic vital signs significant improved after fluid resuscitation.  Laboratory evaluation with a BUN of 33, creatinine 3.28 but this patient's baseline.  Leukocytosis to 18.8 likely stress demargination from recent syncopal event.  D-dimer minimally elevated at 0.55 and patient is low risk by Wells criteria, negative by modified Geneva criteria.  Very low suspicion for PE and patients GFR does not allow for contrasted PE imaging today  regardless.  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  At this time with symptoms resolved and orthostatic vital signs unremarkable after fluid resuscitation he does not meet inpatient criteria for admission he is safe for discharge with outpatient follow-up.  Patient's heart rate at time of discharge is 60 and low suspicion for symptomatic bradycardia at this time.       Glendora Score, MD 05/13/23 1659    Glendora Score, MD 05/13/23 1700

## 2023-05-13 NOTE — ED Triage Notes (Signed)
Pt BIB [facility] for Bradycardia and syncope.  Pt was standing in line at a [employer] and became dizzy.  Pt had syncope and person behind him caught him.  Pt found to be SB at 40bpm B/P 90/70.  Gave 2mg  Glucagon IV.  HR 58, BP 126/76.  Pt takes amlodipine, Carvedilol, Lisinopril and Metformin.  He had recently combined an old and new bottle of carvedilol.  Considering possible beta blocker OD.

## 2023-06-05 ENCOUNTER — Other Ambulatory Visit: Payer: Self-pay | Admitting: Family

## 2023-06-05 DIAGNOSIS — N183 Chronic kidney disease, stage 3 unspecified: Secondary | ICD-10-CM

## 2023-07-01 ENCOUNTER — Other Ambulatory Visit: Payer: Self-pay | Admitting: Family

## 2023-07-01 DIAGNOSIS — E782 Mixed hyperlipidemia: Secondary | ICD-10-CM

## 2023-07-01 DIAGNOSIS — N183 Chronic kidney disease, stage 3 unspecified: Secondary | ICD-10-CM

## 2023-08-01 ENCOUNTER — Other Ambulatory Visit: Payer: Self-pay | Admitting: Family

## 2023-08-10 ENCOUNTER — Other Ambulatory Visit: Payer: Self-pay | Admitting: Family

## 2023-08-10 DIAGNOSIS — E876 Hypokalemia: Secondary | ICD-10-CM

## 2023-09-08 ENCOUNTER — Other Ambulatory Visit: Payer: Self-pay | Admitting: Family

## 2023-09-08 DIAGNOSIS — E876 Hypokalemia: Secondary | ICD-10-CM

## 2023-09-25 ENCOUNTER — Ambulatory Visit: Payer: 59 | Admitting: Family

## 2023-09-30 ENCOUNTER — Ambulatory Visit: Payer: Medicaid Other | Admitting: Family

## 2023-09-30 ENCOUNTER — Encounter: Payer: Self-pay | Admitting: Family

## 2023-09-30 VITALS — BP 136/86 | HR 78 | Temp 97.6°F | Resp 18 | Ht 69.0 in | Wt 182.8 lb

## 2023-09-30 DIAGNOSIS — I129 Hypertensive chronic kidney disease with stage 1 through stage 4 chronic kidney disease, or unspecified chronic kidney disease: Secondary | ICD-10-CM | POA: Diagnosis not present

## 2023-09-30 DIAGNOSIS — E1169 Type 2 diabetes mellitus with other specified complication: Secondary | ICD-10-CM | POA: Diagnosis not present

## 2023-09-30 DIAGNOSIS — J4541 Moderate persistent asthma with (acute) exacerbation: Secondary | ICD-10-CM

## 2023-09-30 DIAGNOSIS — E785 Hyperlipidemia, unspecified: Secondary | ICD-10-CM | POA: Diagnosis not present

## 2023-09-30 DIAGNOSIS — R051 Acute cough: Secondary | ICD-10-CM

## 2023-09-30 DIAGNOSIS — N184 Chronic kidney disease, stage 4 (severe): Secondary | ICD-10-CM

## 2023-09-30 DIAGNOSIS — E559 Vitamin D deficiency, unspecified: Secondary | ICD-10-CM

## 2023-09-30 DIAGNOSIS — F411 Generalized anxiety disorder: Secondary | ICD-10-CM

## 2023-09-30 DIAGNOSIS — K219 Gastro-esophageal reflux disease without esophagitis: Secondary | ICD-10-CM

## 2023-09-30 DIAGNOSIS — N183 Chronic kidney disease, stage 3 unspecified: Secondary | ICD-10-CM

## 2023-09-30 MED ORDER — MONTELUKAST SODIUM 10 MG PO TABS
10.0000 mg | ORAL_TABLET | Freq: Every day | ORAL | 1 refills | Status: DC
Start: 2023-09-30 — End: 2024-03-30

## 2023-09-30 MED ORDER — DOXYCYCLINE HYCLATE 100 MG PO TABS
100.0000 mg | ORAL_TABLET | Freq: Two times a day (BID) | ORAL | 0 refills | Status: AC
Start: 2023-09-30 — End: 2023-10-07

## 2023-09-30 MED ORDER — ALBUTEROL SULFATE HFA 108 (90 BASE) MCG/ACT IN AERS
INHALATION_SPRAY | RESPIRATORY_TRACT | 11 refills | Status: AC
Start: 2023-09-30 — End: ?

## 2023-09-30 NOTE — Progress Notes (Signed)
Provider: Richarda Blade FNP-C   Rekha Hobbins, Donalee Citrin, NP  Patient Care Team: Geralda Baumgardner, Donalee Citrin, NP as PCP - General (Family Medicine)  Extended Emergency Contact Information Primary Emergency Contact: bell,brandon Mobile Phone: [phone] Relation: Spouse Secondary Emergency Contact: Deems,Benjamin Mobile Phone: [phone] Relation: Father  Code Status:  Full Code  Goals of care: Advanced Directive information    09/30/2023    2:43 PM  Advanced Directives  Does Patient Have a Medical Advance Directive? No  Would patient like information on creating a medical advance directive? No - Patient declined     Chief Complaint  Patient presents with   Medical Management of Chronic Issues    6 month follow up discuss pneumoccal vaccine and Diabetic kidney evaluation.   Discussed the use of AI scribe software for clinical note transcription with the patient, who gave verbal consent to proceed.  HPI:  Pt is a 43 y.o. male seen today with a history of asthma, diabetes, and stage 4 kidney disease, presents for a six-month follow-up appointment. He reports feeling well overall and has recently started therapy, which he finds beneficial. He has gained weight since the last visit, which he attributes to the holiday season and not adhering strictly to his diet.  The patient has been experiencing a persistent cough for the past four to five days, which he initially treated with over-the-counter cold medications. He reports improvement in his symptoms, including regaining his sense of taste and improved breathing. However, he still experiences wheezing and can hear it in his chest. He has not had any fever or chills.  The patient also reports that his blood sugar has been well-controlled, although he admits to not eating entirely right during the holidays. He is physically active, walking to church and the store regularly. He is currently living with and taking care of his father.  The patient  has been taking his prescribed medications, including vitamin D, potassium, omeprazole, and glimepiride. He has not been taking Singulair, which was prescribed for allergies, but he is open to starting it again for asthma management. He has not had the albuterol inhaler for a while and would like a refill, especially given his current wheezing.  The patient has been experiencing frequent urination, which has remained the same. He manages it by avoiding sodas and primarily drinking ginger ale. He expresses a weakness for fried foods. He has not been to the dentist in a while due to insurance issues but plans to schedule a visit now that he has Medicaid.       Past Medical History:  Diagnosis Date   Anxiety 01/04/2020   Elevated lipids 11/2019   Elevated serum creatinine 11/2019   GERD (gastroesophageal reflux disease)    Hypertension    Hypertensive crisis 11/2019   Hypokalemia 11/2019   Vitamin D deficiency 11/2019   History reviewed. No pertinent surgical history.  No Known Allergies  Allergies as of 09/30/2023   No Known Allergies      Medication List        Accurate as of September 30, 2023  3:02 PM. If you have any questions, ask your nurse or doctor.          Accu-Chek Aviva Plus test strip Generic drug: glucose blood Use to test blood sugar twice daily. Dx: E11.69   Accu-Chek Aviva Plus w/Device Kit Use to test blood sugar twice daily. Dx: E11.69   Accu-Chek Softclix Lancets lancets Use to test blood sugar twice daily. Dx: E11.69  albuterol 108 (90 Base) MCG/ACT inhaler Commonly known as: VENTOLIN HFA INHALE 1-2 PUFFS BY MOUTH EVERY 6 HOURS AS NEEDED FOR WHEEZE OR SHORTNESS OF BREATH   amLODipine 10 MG tablet Commonly known as: NORVASC TAKE 1 TABLET BY MOUTH EVERY DAY   atorvastatin 40 MG tablet Commonly known as: LIPITOR TAKE 1 TABLET BY MOUTH EVERY DAY   busPIRone 15 MG tablet Commonly known as: BUSPAR TAKE 1 TABLET BY MOUTH 2 TIMES DAILY.    carvedilol 6.25 MG tablet Commonly known as: COREG TAKE 1 TABLET(6.25 MG) BY MOUTH TWICE DAILY WITH A MEAL   cetirizine 10 MG tablet Commonly known as: ZYRTEC Take 1 tablet (10 mg total) by mouth daily.   cloNIDine 0.1 MG tablet Commonly known as: CATAPRES Take 1 tablet (0.1 mg total) by mouth 3 (three) times daily.   Debrox 6.5 % OTIC solution Generic drug: carbamide peroxide Place 5 drops into both ears 2 (two) times daily.   diphenhydrAMINE 25 MG tablet Commonly known as: BENADRYL Take 25 mg by mouth every 8 (eight) hours as needed for itching or allergies.   glimepiride 1 MG tablet Commonly known as: AMARYL TAKE 1 TABLET BY MOUTH DAILY WITH BREAKFAST.   hydrOXYzine 10 MG tablet Commonly known as: ATARAX Take 1 tablet (10 mg total) by mouth 3 (three) times daily as needed for anxiety.   ibuprofen 600 MG tablet Commonly known as: ADVIL TAKE 1 TABLET (600 MG TOTAL) BY MOUTH EVERY 8 (EIGHT) HOURS AS NEEDED.   lisinopril 30 MG tablet Commonly known as: ZESTRIL TAKE 1 TABLET(30 MG) BY MOUTH DAILY   methylPREDNISolone 4 MG Tbpk tablet Commonly known as: MEDROL DOSEPAK Take as prescribed   omega-3 acid ethyl esters 1 g capsule Commonly known as: LOVAZA TAKE 2 CAPSULES BY MOUTH DAILY   omeprazole 20 MG capsule Commonly known as: PRILOSEC Take 1 capsule (20 mg total) by mouth daily as needed (acid reflux).   potassium chloride SA 20 MEQ tablet Commonly known as: KLOR-CON M TAKE 1 TABLET BY MOUTH EVERY DAY   Vitamin D (Ergocalciferol) 1.25 MG (50000 UNIT) Caps capsule Commonly known as: DRISDOL Take 1 capsule (50,000 Units total) by mouth every 7 (seven) days.        Review of Systems  Constitutional:  Negative for appetite change, chills, fatigue, fever and unexpected weight change.  HENT:  Negative for congestion, dental problem, ear discharge, ear pain, facial swelling, hearing loss, nosebleeds, postnasal drip, rhinorrhea, sinus pressure, sinus pain,  sneezing, sore throat, tinnitus and trouble swallowing.   Eyes:  Negative for pain, discharge, redness, itching and visual disturbance.  Respiratory:  Negative for cough, chest tightness, shortness of breath and wheezing.   Cardiovascular:  Negative for chest pain, palpitations and leg swelling.  Gastrointestinal:  Negative for abdominal distention, abdominal pain, blood in stool, constipation, diarrhea, nausea and vomiting.  Endocrine: Negative for cold intolerance, heat intolerance, polydipsia, polyphagia and polyuria.  Genitourinary:  Negative for difficulty urinating, dysuria, flank pain, frequency and urgency.  Musculoskeletal:  Negative for arthralgias, back pain, gait problem, joint swelling, myalgias, neck pain and neck stiffness.  Skin:  Negative for color change, pallor, rash and wound.  Neurological:  Negative for dizziness, syncope, speech difficulty, weakness, light-headedness, numbness and headaches.  Hematological:  Does not bruise/bleed easily.  Psychiatric/Behavioral:  Negative for agitation, behavioral problems, confusion, hallucinations, self-injury, sleep disturbance and suicidal ideas. The patient is nervous/anxious.     Immunization History  Administered Date(s) Administered   Influenza, Mdck, Trivalent,PF 6+ MOS(egg  free) 09/17/2023   Influenza,inj,Quad PF,6+ Mos 10/15/2022   PFIZER(Purple Top)SARS-COV-2 Vaccination 11/26/2019, 12/28/2019   Pfizer(Comirnaty)Fall Seasonal Vaccine 12 years and older 09/17/2023   Tdap 10/15/2022   Pertinent  Health Maintenance Due  Topic Date Due   INFLUENZA VACCINE  Completed      10/23/2021    9:15 AM 11/20/2021    9:04 AM 10/15/2022    8:49 AM 11/05/2022   10:56 AM 09/30/2023    2:42 PM  Fall Risk  Falls in the past year? 0 0 0 0 0  Was there an injury with Fall?  0 0 1 0  Fall Risk Category Calculator  0 0 1 0  Fall Risk Category (Retired)  Low     (RETIRED) Patient Fall Risk Level  Low fall risk     Patient at Risk for Falls  Due to  No Fall Risks No Fall Risks    Fall risk Follow up  Falls evaluation completed Falls evaluation completed     Functional Status Survey:    Vitals:   09/30/23 1447  BP: 136/86  Pulse: 78  Resp: 18  Temp: 97.6 F (36.4 C)  SpO2: 94%  Weight: 182 lb 12.8 oz (82.9 kg)  Height: 5\' 9"  (1.753 m)   Body mass index is 26.99 kg/m. Physical Exam  VITALS: T- 97.6, P- 78, BP- 136/86, SaO2- 94 MEASUREMENTS: WT- 182.8 HEENT: Eardrums normal, no signs of infection. Nasal mucosa perforated, no swelling. Pharynx normal. Pupillary response to light normal, extra-ocular movements intact. NECK: No lymphadenopathy. CHEST: Wheezing on lung auscultation. CARDIOVASCULAR: Heart sounds normal on auscultation. ABDOMEN: Non-tender on palpation. EXTREMITIES: No swelling, full range of motion in both legs. NEUROLOGICAL: Cranial nerves II, III, IV, and VI intact.  PSYCHIATRY/BEHAVIORAL: Mood stable      Labs reviewed: Recent Labs    11/05/22 1135 03/25/23 1536 05/13/23 1341  NA 136 137 133*  K 5.0 4.6 5.0  CL 108 106 108  CO2 18* 22 18*  GLUCOSE 134 100 185*  BUN 43* 41* 33*  CREATININE 2.95* 3.50* 3.28*  CALCIUM 9.8 10.0 8.9   Recent Labs    10/15/22 0957 03/25/23 1536 05/13/23 1341  AST 35 17 18  ALT 23 13 13   ALKPHOS  --   --  83  BILITOT 0.4 0.3 0.5  PROT 6.9 7.2 6.7  ALBUMIN  --   --  3.5   Recent Labs    10/15/22 0957 03/25/23 1536 05/13/23 1341  WBC 13.2* 12.8* 18.8*  NEUTROABS 8,224* 7,923* 15.8*  HGB 16.8 15.2 14.9  HCT 48.1 45.3 47.7  MCV 82.8 87.5 91.7  PLT 356 340 272   Lab Results  Component Value Date   TSH 0.91 03/25/2023   Lab Results  Component Value Date   HGBA1C 5.7 (H) 03/25/2023   Lab Results  Component Value Date   CHOL 120 03/25/2023   HDL 36 (L) 03/25/2023   LDLCALC 50 03/25/2023   TRIG 291 (H) 03/25/2023   CHOLHDL 3.3 03/25/2023    Significant Diagnostic Results in last 30 days:  No results found.  Assessment/Plan  Asthma  exacerbation   Reports wheezing and shortness of breath, especially when exposed to irritants at work. Symptoms worsened after a recent cold. Wheezing noted on lung exam. Likely exacerbated by recent respiratory infection and environmental irritants. Discussed the importance of using a mask at work to reduce exposure to irritants. Explained that albuterol is a rescue inhaler for immediate relief and Singulair is for long-term  control of asthma symptoms.   - Refill albuterol inhaler with 11 refills   - Prescribe Singulair (montelukast) for asthma, 90-day supply   - Advise to use a mask at work to reduce exposure to irritants   - Prescribe doxycycline, 100 mg, one tablet twice daily for 7 days   - will add Prednisone if no relief with albuterol  - doxycycline (VIBRA-TABS) 100 MG tablet; Take 1 tablet (100 mg total) by mouth 2 (two) times daily for 7 days.  Dispense: 14 tablet; Refill: 0 - albuterol (VENTOLIN HFA) 108 (90 Base) MCG/ACT inhaler; INHALE 1-2 PUFFS BY MOUTH EVERY 6 HOURS AS NEEDED FOR WHEEZE OR SHORTNESS OF BREATH  Dispense: 8 each; Refill: 11 - montelukast (SINGULAIR) 10 MG tablet; Take 1 tablet (10 mg total) by mouth at bedtime.  Dispense: 90 tablet; Refill: 1  Chronic Kidney Disease Stage 4   Stage 4 CKD with recent creatinine level of 3.5, up from 2.9. Has not been following up with a nephrologist due to cost. Discussed the importance of follow-up with a nephrologist now that he has insurance. Emphasized the need to stay hydrated and control blood pressure and blood sugar to manage CKD.   - Order renal function tests   - Encourage follow-up with a nephrologist now that he has insurance   - Advise to stay hydrated and control blood pressure and blood sugar   - COMPLETE METABOLIC PANEL WITH GFR; Future  Type 2 Diabetes Mellitus   Reports good blood sugar control with glimepiride but admits to dietary indiscretions during the holidays. Last A1c was 5.7 six months ago. Emphasized the  importance of dietary modifications and regular exercise to maintain blood sugar control.   - Order hemoglobin A1c test   - Continue glimepiride 1 mg daily   - Encourage dietary modifications and regular exercise   - Lipid panel; Future - TSH; Future - COMPLETE METABOLIC PANEL WITH GFR; Future - CBC with Differential/Platelet; Future - Hemoglobin A1c; Future  Hypertension   Blood pressure today is 136/86 mmHg. On multiple antihypertensive medications including lisinopril, carvedilol, and amlodipine. Discussed the importance of limiting sodium intake and avoiding dark sodas to help control blood pressure.   - Continue current antihypertensive regimen (lisinopril, carvedilol, amlodipine)   - Advise to limit sodium intake and avoid dark sodas   - Lipid panel; Future - TSH; Future - COMPLETE METABOLIC PANEL WITH GFR; Future - CBC with Differential/Platelet; Future  Hyperlipidemia   On atorvastatin 40 mg daily for cholesterol management.   - Continue atorvastatin 40 mg daily   - Order lipid panel    Vitamin D Deficiency   On vitamin D 50,000 units weekly. Will check vitamin D levels to determine if dose adjustment is needed.   - Check vitamin D levels to determine if dose adjustment is needed   - Vitamin D, 1,25-dihydroxy; Future  3. Gastroesophageal reflux disease, unspecified whether esophagitis present - continue on Omeprazole  - COMPLETE METABOLIC PANEL WITH GFR; Future - CBC with Differential/Platelet; Future   General Health Maintenance   Generally in good health with no new complaints. Seeing a therapist three times a week for mental health support. Discussed the importance of regular dental check-ups, limiting fried foods, and maintaining a balanced diet and regular physical activity.   - Encourage regular dental check-ups   - Advise to limit fried foods and maintain a balanced diet   - Encourage regular physical activity   Follow-up   - Schedule lab work for next  Monday  at 8:15 AM   - Schedule follow-up appointment in six months.      Family/ staff Communication: Reviewed plan of care with patient verbalized understanding   Labs/tests ordered:  - Hemoglobin A1c; Future - Lipid panel; Future - TSH; Future - COMPLETE METABOLIC PANEL WITH GFR; Future - CBC with Differential/Platelet; Future - Vitamin D, 1,25-dihydroxy; Future  Next Appointment : Return in about 6 months (around 03/29/2024) for medical mangement of chronic issues.Caesar Bookman, NP

## 2023-10-07 ENCOUNTER — Other Ambulatory Visit: Payer: Self-pay | Admitting: Family

## 2023-10-07 DIAGNOSIS — E876 Hypokalemia: Secondary | ICD-10-CM

## 2023-10-09 ENCOUNTER — Other Ambulatory Visit: Payer: Self-pay

## 2023-10-09 ENCOUNTER — Other Ambulatory Visit: Payer: Medicaid Other

## 2023-10-09 DIAGNOSIS — E1169 Type 2 diabetes mellitus with other specified complication: Secondary | ICD-10-CM

## 2023-10-09 DIAGNOSIS — N184 Chronic kidney disease, stage 4 (severe): Secondary | ICD-10-CM

## 2023-10-09 DIAGNOSIS — K219 Gastro-esophageal reflux disease without esophagitis: Secondary | ICD-10-CM

## 2023-10-09 DIAGNOSIS — I129 Hypertensive chronic kidney disease with stage 1 through stage 4 chronic kidney disease, or unspecified chronic kidney disease: Secondary | ICD-10-CM

## 2023-10-09 DIAGNOSIS — E559 Vitamin D deficiency, unspecified: Secondary | ICD-10-CM

## 2023-10-14 LAB — COMPLETE METABOLIC PANEL WITH GFR
AG Ratio: 1.3 (calc) (ref 1.0–2.5)
ALT: 13 U/L (ref 9–46)
AST: 16 U/L (ref 10–40)
Albumin: 4 g/dL (ref 3.6–5.1)
Alkaline phosphatase (APISO): 84 U/L (ref 36–130)
BUN/Creatinine Ratio: 15 (calc) (ref 6–22)
BUN: 45 mg/dL — ABNORMAL HIGH (ref 7–25)
CO2: 21 mmol/L (ref 20–32)
Calcium: 9.9 mg/dL (ref 8.6–10.3)
Chloride: 107 mmol/L (ref 98–110)
Creat: 2.96 mg/dL — ABNORMAL HIGH (ref 0.60–1.29)
Globulin: 3 g/dL (ref 1.9–3.7)
Glucose, Bld: 119 mg/dL — ABNORMAL HIGH (ref 65–99)
Potassium: 4.1 mmol/L (ref 3.5–5.3)
Sodium: 136 mmol/L (ref 135–146)
Total Bilirubin: 0.4 mg/dL (ref 0.2–1.2)
Total Protein: 7 g/dL (ref 6.1–8.1)
eGFR: 26 mL/min/{1.73_m2} — ABNORMAL LOW (ref >=60)

## 2023-10-14 LAB — LIPID PANEL
Cholesterol: 147 mg/dL (ref <200)
HDL: 42 mg/dL (ref >=40)
LDL Cholesterol (Calc): 75 mg/dL
Non-HDL Cholesterol (Calc): 105 mg/dL (ref <130)
Total CHOL/HDL Ratio: 3.5 (calc) (ref <5.0)
Triglycerides: 198 mg/dL — ABNORMAL HIGH (ref <150)

## 2023-10-14 LAB — VITAMIN D 1,25 DIHYDROXY
Vitamin D 1, 25 (OH)2 Total: 36 pg/mL (ref 18–72)
Vitamin D2 1, 25 (OH)2: 36 pg/mL
Vitamin D3 1, 25 (OH)2: <8 pg/mL

## 2023-10-14 LAB — CBC WITH DIFFERENTIAL/PLATELET
Absolute Lymphocytes: 2601 {cells}/uL (ref 850–3900)
Absolute Monocytes: 785 {cells}/uL (ref 200–950)
Basophils Absolute: 71 {cells}/uL (ref 0–200)
Basophils Relative: 0.7 %
Eosinophils Absolute: 377 {cells}/uL (ref 15–500)
Eosinophils Relative: 3.7 %
HCT: 45 % (ref 38.5–50.0)
Hemoglobin: 14.8 g/dL (ref 13.2–17.1)
MCH: 29.2 pg (ref 27.0–33.0)
MCHC: 32.9 g/dL (ref 32.0–36.0)
MCV: 88.8 fL (ref 80.0–100.0)
MPV: 9.4 fL (ref 7.5–12.5)
Monocytes Relative: 7.7 %
Neutro Abs: 6365 {cells}/uL (ref 1500–7800)
Neutrophils Relative %: 62.4 %
Platelets: 316 10*3/uL (ref 140–400)
RBC: 5.07 10*6/uL (ref 4.20–5.80)
RDW: 12.4 % (ref 11.0–15.0)
Total Lymphocyte: 25.5 %
WBC: 10.2 10*3/uL (ref 3.8–10.8)

## 2023-10-14 LAB — TSH: TSH: 0.64 m[IU]/L (ref 0.40–4.50)

## 2023-10-14 LAB — HEMOGLOBIN A1C
Hgb A1c MFr Bld: 6.3 %{Hb} — ABNORMAL HIGH (ref <5.7)
Mean Plasma Glucose: 134 mg/dL
eAG (mmol/L): 7.4 mmol/L

## 2023-10-27 ENCOUNTER — Other Ambulatory Visit: Payer: Self-pay | Admitting: Family

## 2023-10-27 DIAGNOSIS — E876 Hypokalemia: Secondary | ICD-10-CM

## 2023-10-28 NOTE — Telephone Encounter (Signed)
 High Risk Warning Populated when attempting to refill, I will send to Provider for further review

## 2023-10-30 ENCOUNTER — Other Ambulatory Visit: Payer: Self-pay | Admitting: Family

## 2023-10-30 DIAGNOSIS — E559 Vitamin D deficiency, unspecified: Secondary | ICD-10-CM

## 2023-10-30 NOTE — Telephone Encounter (Signed)
 Patient is requesting 7 refills on medication. Medication pend and sent to PCP Ngetich, Donalee Citrin, NP for approval.

## 2023-12-23 ENCOUNTER — Other Ambulatory Visit: Payer: Self-pay | Admitting: Family

## 2023-12-23 DIAGNOSIS — N183 Chronic kidney disease, stage 3 unspecified: Secondary | ICD-10-CM

## 2024-01-07 ENCOUNTER — Other Ambulatory Visit: Payer: Self-pay | Admitting: Family

## 2024-01-22 ENCOUNTER — Other Ambulatory Visit: Payer: Self-pay | Admitting: Family

## 2024-01-22 DIAGNOSIS — N183 Chronic kidney disease, stage 3 unspecified: Secondary | ICD-10-CM

## 2024-02-03 ENCOUNTER — Other Ambulatory Visit: Payer: Self-pay | Admitting: Family

## 2024-02-03 DIAGNOSIS — I129 Hypertensive chronic kidney disease with stage 1 through stage 4 chronic kidney disease, or unspecified chronic kidney disease: Secondary | ICD-10-CM

## 2024-02-03 MED ORDER — LISINOPRIL 30 MG PO TABS: ORAL_TABLET | ORAL | 1 refills | Status: DC

## 2024-02-03 NOTE — Telephone Encounter (Signed)
 Copied from CRM [id number]. Topic: Clinical - Medication Refill >> Feb 03, 2024  8:34 AM Lenon Radar A wrote: Medication: lisinopril  (ZESTRIL ) 30 MG tablet  Has the patient contacted their pharmacy? Yes (Agent: If no, request that the patient contact the pharmacy for the refill. If patient does not wish to contact the pharmacy document the reason why and proceed with request.) (Agent: If yes, when and what did the pharmacy advise?)  Patient contacted pharmacy and was advised there are no refills available.   This is the patient's preferred pharmacy: [id number] - [place], [place] - [address] AT [facility] [address] [place] [address] Phone: [phone] Fax: [phone]  Is this the correct pharmacy for this prescription? Yes If no, delete pharmacy and type the correct one.   Has the prescription been filled recently? No  Is the patient out of the medication? Yes  Has the patient been seen for an appointment in the last year OR does the patient have an upcoming appointment? Yes  Can we respond through MyChart? Yes  Agent: Please be advised that Rx refills may take up to 3 business days. We ask that you follow-up with your pharmacy.

## 2024-02-20 ENCOUNTER — Other Ambulatory Visit: Payer: Self-pay | Admitting: Family

## 2024-02-20 DIAGNOSIS — E782 Mixed hyperlipidemia: Secondary | ICD-10-CM

## 2024-02-26 ENCOUNTER — Other Ambulatory Visit: Payer: Self-pay | Admitting: Family

## 2024-02-26 DIAGNOSIS — K219 Gastro-esophageal reflux disease without esophagitis: Secondary | ICD-10-CM

## 2024-03-14 ENCOUNTER — Other Ambulatory Visit: Payer: Self-pay | Admitting: Family

## 2024-03-16 NOTE — Telephone Encounter (Signed)
Pharmacy is requesting 90 day prescription

## 2024-03-28 ENCOUNTER — Other Ambulatory Visit: Payer: Self-pay | Admitting: Family

## 2024-03-28 DIAGNOSIS — J4541 Moderate persistent asthma with (acute) exacerbation: Secondary | ICD-10-CM

## 2024-03-30 ENCOUNTER — Ambulatory Visit: Payer: MEDICAID | Admitting: Family

## 2024-04-01 ENCOUNTER — Other Ambulatory Visit: Payer: Self-pay | Admitting: Family

## 2024-04-01 DIAGNOSIS — E1169 Type 2 diabetes mellitus with other specified complication: Secondary | ICD-10-CM

## 2024-04-02 ENCOUNTER — Other Ambulatory Visit: Payer: Self-pay | Admitting: Family

## 2024-04-02 DIAGNOSIS — N183 Chronic kidney disease, stage 3 unspecified: Secondary | ICD-10-CM

## 2024-05-25 LAB — BASIC METABOLIC PANEL WITH GFR
BUN: 33 — AB (ref 4–21)
CO2: 24 — AB (ref 13–22)
Chloride: 106 (ref 99–108)
Creatinine: 2.3 — AB (ref 0.6–1.3)
Glucose: 182
Potassium: 4.5 meq/L (ref 3.5–5.1)
Sodium: 140 (ref 137–147)

## 2024-05-25 LAB — COMPREHENSIVE METABOLIC PANEL WITH GFR
Albumin: 4 (ref 3.5–5.0)
Calcium: 10.1 (ref 8.7–10.7)

## 2024-05-25 LAB — IRON,TIBC AND FERRITIN PANEL
%SAT: 31
Ferritin: 293
Iron: 89
TIBC: 288
UIBC: 199

## 2024-05-25 LAB — CBC AND DIFFERENTIAL: Hemoglobin: 14.5 (ref 13.5–17.5)

## 2024-05-27 ENCOUNTER — Other Ambulatory Visit: Payer: Self-pay | Admitting: Internal Medicine

## 2024-05-27 DIAGNOSIS — N184 Chronic kidney disease, stage 4 (severe): Secondary | ICD-10-CM

## 2024-06-16 ENCOUNTER — Encounter: Admitting: Family

## 2024-06-24 ENCOUNTER — Ambulatory Visit (INDEPENDENT_AMBULATORY_CARE_PROVIDER_SITE_OTHER): Payer: MEDICAID | Admitting: Family

## 2024-06-24 ENCOUNTER — Encounter: Payer: Self-pay | Admitting: Family

## 2024-06-24 VITALS — BP 118/72 | HR 57 | Temp 97.0°F | Ht 69.0 in | Wt 167.0 lb

## 2024-06-24 DIAGNOSIS — K219 Gastro-esophageal reflux disease without esophagitis: Secondary | ICD-10-CM

## 2024-06-24 DIAGNOSIS — E876 Hypokalemia: Secondary | ICD-10-CM

## 2024-06-24 DIAGNOSIS — M79671 Pain in right foot: Secondary | ICD-10-CM

## 2024-06-24 DIAGNOSIS — E1169 Type 2 diabetes mellitus with other specified complication: Secondary | ICD-10-CM | POA: Diagnosis not present

## 2024-06-24 DIAGNOSIS — F411 Generalized anxiety disorder: Secondary | ICD-10-CM

## 2024-06-24 DIAGNOSIS — I10 Essential (primary) hypertension: Secondary | ICD-10-CM

## 2024-06-24 DIAGNOSIS — J4541 Moderate persistent asthma with (acute) exacerbation: Secondary | ICD-10-CM

## 2024-06-24 DIAGNOSIS — E785 Hyperlipidemia, unspecified: Secondary | ICD-10-CM

## 2024-06-24 DIAGNOSIS — N184 Chronic kidney disease, stage 4 (severe): Secondary | ICD-10-CM

## 2024-06-24 MED ORDER — ACETAMINOPHEN 500 MG PO TABS: 500.0000 mg | ORAL_TABLET | Freq: Three times a day (TID) | ORAL | 3 refills | Status: AC | PRN

## 2024-06-24 MED ORDER — POTASSIUM CHLORIDE CRYS ER 20 MEQ PO TBCR: 20.0000 meq | EXTENDED_RELEASE_TABLET | Freq: Every day | ORAL | 1 refills | Status: AC

## 2024-06-24 NOTE — Progress Notes (Signed)
   This encounter was created in error - please disregard. No show

## 2024-06-24 NOTE — Progress Notes (Signed)
 Provider: Roxan Plough FNP-C   Findlay Dagher, Roxan BROCKS, NP  Patient Care Team: Romona Murdy, Roxan BROCKS, NP as PCP - General (Family Medicine)  Extended Emergency Contact Information Primary Emergency Contact: bell,brandon Mobile Phone: [phone] Relation: Spouse Secondary Emergency Contact: Tsutsui,Benjamin Mobile Phone: [phone] Relation: Father  Code Status:  Full Code  Goals of care: Advanced Directive information    09/30/2023    2:43 PM  Advanced Directives  Does Patient Have a Medical Advance Directive? No  Would patient like information on creating a medical advance directive? No - Patient declined     Chief Complaint  Patient presents with   Medication Management    Patient needs alternative for [facility] Powder. Patient was taking for leg pain, however the kidney specialist told him to stop and consult with PCP. Patient also needs a refill on Potassium- [facility]      Discussed the use of AI scribe software for clinical note transcription with the patient, who gave verbal consent to proceed.  History of Present Illness   Jeremiah [name] is a 43 year old male with Essential Hypertension, Asthma, Generalized Anxiety Disorder ,Type 2 DM, Hyperlipidemia, chronic kidney disease stage 4 who presents for medication management.  He missed his six-month follow-up appointment and is here for medication management. He reported that the kidney specialist told him to stop taking BC tablets because they contain aspirin and said it was making his kidney condition worse. He reported that he has tried Tylenol in the past and it did not work for him. He is currently working two jobs, which requires him to stay active and manage pain effectively.  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 He has been wearing Crocs to work to alleviate the discomfort. He also experiences back  pain, which he mentioned to his kidney specialist, but is unsure if it is related to his kidney condition or prolonged standing. He experiences tingling in his legs after standing for long periods.  He has a history of acid reflux, which he experiences daily. He attributes this to eating habits such as eating quickly and lying down after meals.  He has been walking a lot and working two jobs, which has contributed to weight loss. He drinks a bottle of water every thirty minutes to an hour and has cut out sodas from his diet.  No issues with asthma since he stopped vaping. No depression, anxiety, forehead pain, or sinus tenderness.   Past Medical History:  Diagnosis Date   Anxiety 01/04/2020   Elevated lipids 11/2019   Elevated serum creatinine 11/2019   GERD (gastroesophageal reflux disease)    Hypertension    Hypertensive crisis 11/2019   Hypokalemia 11/2019   Vitamin D  deficiency 11/2019   History reviewed. No pertinent surgical history.  No Known Allergies  Allergies as of 06/24/2024   No Known Allergies      Medication List        Accurate as of June 24, 2024  1:29 PM. If you have any questions, ask your nurse or doctor.          Accu-Chek Aviva Plus test strip Generic drug: glucose blood Use to test blood sugar twice daily. Dx: E11.69   Accu-Chek Aviva Plus w/Device Kit Use to test blood sugar twice daily. Dx: E11.69   Accu-Chek Softclix Lancets lancets Use to test blood sugar  twice daily. Dx: E11.69   acetaminophen 500 MG tablet Commonly known as: TYLENOL Take 1 tablet (500 mg total) by mouth every 8 (eight) hours as needed. Started by: Eain Mullendore C Iyani Dresner   albuterol  108 (90 Base) MCG/ACT inhaler Commonly known as: VENTOLIN  HFA INHALE 1-2 PUFFS BY MOUTH EVERY 6 HOURS AS NEEDED FOR WHEEZE OR SHORTNESS OF BREATH   amLODipine  10 MG tablet Commonly known as: NORVASC  TAKE 1 TABLET BY MOUTH EVERY DAY   atorvastatin  40 MG tablet Commonly known as:  LIPITOR TAKE 1 TABLET BY MOUTH EVERY DAY   busPIRone  15 MG tablet Commonly known as: BUSPAR  TAKE 1 TABLET BY MOUTH TWICE A DAY   carvedilol  6.25 MG tablet Commonly known as: COREG  TAKE 1 TABLET(6.25 MG) BY MOUTH TWICE DAILY WITH A MEAL   cetirizine  10 MG tablet Commonly known as: ZYRTEC  Take 1 tablet (10 mg total) by mouth daily.   diphenhydrAMINE 25 MG tablet Commonly known as: BENADRYL Take 25 mg by mouth every 8 (eight) hours as needed for itching or allergies.   glimepiride  1 MG tablet Commonly known as: AMARYL  TAKE 1 TABLET BY MOUTH DAILY WITH BREAKFAST.   lisinopril  30 MG tablet Commonly known as: ZESTRIL  TAKE 1 TABLET(30 MG) BY MOUTH DAILY   montelukast  10 MG tablet Commonly known as: SINGULAIR  TAKE 1 TABLET BY MOUTH EVERYDAY AT BEDTIME   omeprazole  20 MG capsule Commonly known as: PRILOSEC TAKE 1 CAPSULE (20 MG TOTAL) BY MOUTH DAILY AS NEEDED FOR ACID REFLUX   potassium chloride  SA 20 MEQ tablet Commonly known as: KLOR-CON  M Take 1 tablet (20 mEq total) by mouth daily.   Vitamin D  (Ergocalciferol ) 1.25 MG (50000 UNIT) Caps capsule Commonly known as: DRISDOL  TAKE 1 CAPSULE EVERY 7 DAYS        Review of Systems  Constitutional:  Negative for appetite change, chills, fatigue, fever and unexpected weight change.  HENT:  Negative for congestion, dental problem, ear discharge, ear pain, hearing loss, nosebleeds, postnasal drip, rhinorrhea, sinus pressure, sinus pain, sneezing, sore throat, tinnitus and trouble swallowing.   Eyes:  Negative for pain, discharge, redness, itching and visual disturbance.  Respiratory:  Negative for cough, chest tightness, shortness of breath and wheezing.   Cardiovascular:  Negative for chest pain, palpitations and leg swelling.  Gastrointestinal:  Negative for abdominal distention, abdominal pain, blood in stool, constipation, diarrhea, nausea and vomiting.  Endocrine: Negative for cold intolerance, heat intolerance, polydipsia,  polyphagia and polyuria.  Genitourinary:  Negative for difficulty urinating, dysuria, flank pain, frequency and urgency.  Musculoskeletal:  Positive for arthralgias. Negative for back pain, gait problem, joint swelling, myalgias, neck pain and neck stiffness.       Pain on feet   Skin:  Negative for color change, pallor, rash and wound.  Neurological:  Negative for dizziness, syncope, speech difficulty, weakness, light-headedness, numbness and headaches.       Tingling sensation with prolong standing   Hematological:  Does not bruise/bleed easily.  Psychiatric/Behavioral:  Negative for agitation, behavioral problems, confusion, hallucinations, self-injury, sleep disturbance and suicidal ideas. The patient is not nervous/anxious.     Immunization History  Administered Date(s) Administered   Influenza, Mdck, Trivalent,PF 6+ MOS(egg free) 09/17/2023   Influenza,inj,Quad PF,6+ Mos 10/15/2022, 06/03/2024   PFIZER(Purple Top)SARS-COV-2 Vaccination 11/26/2019, 12/28/2019   Pfizer(Comirnaty)Fall Seasonal Vaccine 12 years and older 09/17/2023   Tdap 10/15/2022   Pertinent  Health Maintenance Due  Topic Date Due   Influenza Vaccine  Completed      10/23/2021  9:15 AM 11/20/2021    9:04 AM 10/15/2022    8:49 AM 11/05/2022   10:56 AM 09/30/2023    2:42 PM  Fall Risk  Falls in the past year? 0 0 0 0 0  Was there an injury with Fall?  0 0 1 0  Fall Risk Category Calculator  0 0 1 0  Fall Risk Category (Retired)  Low      (RETIRED) Patient Fall Risk Level  Low fall risk      Patient at Risk for Falls Due to  No Fall Risks No Fall Risks    Fall risk Follow up  Falls evaluation completed  Falls evaluation completed       Data saved with a previous flowsheet row definition   Functional Status Survey:    Vitals:   06/24/24 1245  BP: 118/72  Pulse: (!) 57  Temp: (!) 97 F (36.1 C)  SpO2: 97%  Weight: 167 lb (75.8 kg)  Height: 5' 9 (1.753 m)   Body mass index is 24.66 kg/m. Physical  Exam  VITALS: P- 57, BP- 118/72, SaO2- 97% MEASUREMENTS: Weight- 167. GENERAL: Alert, cooperative, well developed, no acute distress. HEENT: Normocephalic, normal oropharynx, moist mucous membranes, ears normal with clear external auditory canals and normal tympanic membranes, nose normal, oral cavity normal with tongue without lesions, pharynx normal with no exudate or inflammation, no sinus tenderness. CHEST: Clear to auscultation bilaterally, no wheezes, rhonchi, or crackles. CARDIOVASCULAR: Normal heart rate and rhythm, S1 and S2 normal without murmurs. ABDOMEN: Soft, non-tender, non-distended, without organomegaly, normal bowel sounds. EXTREMITIES: No cyanosis or edema. NEUROLOGICAL: Cranial nerves grossly intact, moves all extremities without gross motor or sensory deficit.  SKIN: No rash,no lesion or erythema   PSYCHIATRY/BEHAVIORAL: Mood stable    Labs reviewed: Recent Labs    10/09/23 0820  NA 136  K 4.1  CL 107  CO2 21  GLUCOSE 119*  BUN 45*  CREATININE 2.96*  CALCIUM  9.9   Recent Labs    10/09/23 0820  AST 16  ALT 13  BILITOT 0.4  PROT 7.0   Recent Labs    10/09/23 0820  WBC 10.2  NEUTROABS 6,365  HGB 14.8  HCT 45.0  MCV 88.8  PLT 316   Lab Results  Component Value Date   TSH 0.64 10/09/2023   Lab Results  Component Value Date   HGBA1C 6.3 (H) 10/09/2023   Lab Results  Component Value Date   CHOL 147 10/09/2023   HDL 42 10/09/2023   LDLCALC 75 10/09/2023   TRIG 198 (H) 10/09/2023   CHOLHDL 3.5 10/09/2023    Significant Diagnostic Results in last 30 days:  No results found.  Assessment/Plan  Chronic kidney disease, stage 4 with hypertension Stage 4 chronic kidney disease with improved creatinine level from 3.28 to 2.92. Hypertension is well-controlled at 118/72 mmHg. Heart rate slightly low at 57 bpm, rechecked to 60 bpm. - Obtain lab results from kidney specialist - Avoid aspirin and NSAIDs - Encourage hydration and lifestyle  modifications to support kidney function - Discontinue BC tablets containing aspirin - Recommend extra strength Tylenol (500 mg or 1000 mg) for pain management - Schedule Tylenol intake before work and as needed - Avoid anti-inflammatory medications such as ibuprofen , Aleve , and naproxen   Type 2 diabetes mellitus Previous A1c was 6.3 eight months ago. Weight loss from 187 lbs to 167 lbs, likely due to increased physical activity from working two jobs. Glucose level previously 119 mg/dL. - Recheck A1c and  glucose levels - Encourage continued weight management and physical activity  Gastroesophageal reflux disease (GERD) Experiences daily acid reflux, likely exacerbated by eating habits such as eating quickly and lying down after meals. - Advise to sit up for at least 30 minutes to 1 hour after eating - Avoid fried and spicy foods  Asthma Asthma is well-controlled. He has stopped vaping, which has likely contributed to improved asthma control.  Generalized anxiety disorder Mood stable Continue buspirone  Continue to monitor mood  Right foot pain Suspect due to footwear Recommend supportive shoe Notify provider if symptoms worsen  General Health Maintenance Received flu shot. Advised on supportive footwear to alleviate leg and back pain from prolonged standing at work. - Recommend supportive shoes with insoles for work - Encourage stretching before and after work - Advise on adequate hydration and reducing soda intake  Follow-up Needs to follow up with lab work and six-month appointment. Coordination with kidney specialist for lab results is necessary. - Schedule six-month follow-up appointment - Complete lab work including liver function, cholesterol, thyroid, and A1c - Fill out release of medical records for kidney specialist - Request kidney specialist to send lab results to primary care   Family/ staff Communication: Reviewed plan of care with patient verbalized  understanding  Labs/tests ordered:  - Hgb A1C - TSH - Hepatic panel  - Lipid panel  Next Appointment : Return in about 6 months (around 12/23/2024) for medical mangement of chronic issues.SABRA   Spent 30 minutes of Face to face and non-face to face with patient  >50% time spent counseling; reviewing medical record; tests; labs; documentation and developing future plan of care.   Roxan JAYSON Plough, NP

## 2024-06-25 LAB — HEMOGLOBIN A1C
Hgb A1c MFr Bld: 6.6 % — ABNORMAL HIGH (ref <5.7)
Mean Plasma Glucose: 143 mg/dL
eAG (mmol/L): 7.9 mmol/L

## 2024-06-25 LAB — LIPID PANEL
Cholesterol: 111 mg/dL (ref <200)
HDL: 33 mg/dL — ABNORMAL LOW (ref >=40)
LDL Cholesterol (Calc): 40 mg/dL
Non-HDL Cholesterol (Calc): 78 mg/dL (ref <130)
Total CHOL/HDL Ratio: 3.4 (calc) (ref <5.0)
Triglycerides: 360 mg/dL — ABNORMAL HIGH (ref <150)

## 2024-06-25 LAB — HEPATIC FUNCTION PANEL
AG Ratio: 1.3 (calc) (ref 1.0–2.5)
ALT: 15 U/L (ref 9–46)
AST: 16 U/L (ref 10–40)
Albumin: 4 g/dL (ref 3.6–5.1)
Alkaline phosphatase (APISO): 102 U/L (ref 36–130)
Bilirubin, Direct: 0.1 mg/dL (ref 0.0–0.2)
Globulin: 3.1 g/dL (ref 1.9–3.7)
Indirect Bilirubin: 0.1 mg/dL — ABNORMAL LOW (ref 0.2–1.2)
Total Bilirubin: 0.2 mg/dL (ref 0.2–1.2)
Total Protein: 7.1 g/dL (ref 6.1–8.1)

## 2024-06-25 LAB — TSH: TSH: 0.38 m[IU]/L — ABNORMAL LOW (ref 0.40–4.50)

## 2024-06-28 ENCOUNTER — Other Ambulatory Visit: Payer: Self-pay | Admitting: Family

## 2024-06-28 DIAGNOSIS — N183 Chronic kidney disease, stage 3 unspecified: Secondary | ICD-10-CM

## 2024-07-02 ENCOUNTER — Ambulatory Visit: Payer: Self-pay | Admitting: Family

## 2024-07-10 ENCOUNTER — Other Ambulatory Visit: Payer: Self-pay

## 2024-07-10 ENCOUNTER — Encounter (HOSPITAL_BASED_OUTPATIENT_CLINIC_OR_DEPARTMENT_OTHER): Payer: Self-pay | Admitting: Emergency Medicine

## 2024-07-10 ENCOUNTER — Emergency Department (HOSPITAL_BASED_OUTPATIENT_CLINIC_OR_DEPARTMENT_OTHER)
Admission: EM | Admit: 2024-07-10 | Discharge: 2024-07-10 | Disposition: A | Discharge status: Home / Self Care | Payer: MEDICAID | Attending: Emergency Medicine | Admitting: Emergency Medicine

## 2024-07-10 DIAGNOSIS — I129 Hypertensive chronic kidney disease with stage 1 through stage 4 chronic kidney disease, or unspecified chronic kidney disease: Secondary | ICD-10-CM | POA: Insufficient documentation

## 2024-07-10 DIAGNOSIS — N189 Chronic kidney disease, unspecified: Secondary | ICD-10-CM | POA: Insufficient documentation

## 2024-07-10 DIAGNOSIS — T7840XA Allergy, unspecified, initial encounter: Secondary | ICD-10-CM | POA: Diagnosis present

## 2024-07-10 DIAGNOSIS — Z79899 Other long term (current) drug therapy: Secondary | ICD-10-CM | POA: Diagnosis not present

## 2024-07-10 MED ORDER — LORATADINE 10 MG PO TABS
10.0000 mg | ORAL_TABLET | Freq: Once | ORAL | Status: AC
  Administered 2024-07-10: 10 mg via ORAL
  Filled 2024-07-10: qty 1

## 2024-07-10 MED ORDER — ONDANSETRON 4 MG PO TBDP: 4.0000 mg | ORAL_TABLET | Freq: Three times a day (TID) | ORAL | 0 refills | Status: AC | PRN

## 2024-07-10 MED ORDER — ONDANSETRON 4 MG PO TBDP
4.0000 mg | ORAL_TABLET | Freq: Once | ORAL | Status: AC
Start: 2024-07-10 — End: 2024-07-10
  Administered 2024-07-10: 4 mg via ORAL
  Filled 2024-07-10 (×2): qty 1

## 2024-07-10 NOTE — Discharge Instructions (Addendum)
 You can take 1 Zyrtec  or 1 Claritin 1 time a day as needed for the next few days if you get more hives.  Benadryl is also okay

## 2024-07-10 NOTE — ED Notes (Signed)
 DC paperwork given and verbally understood.

## 2024-07-10 NOTE — ED Provider Notes (Signed)
 [place] EMERGENCY DEPARTMENT AT [facility] Provider Note   CSN: [id number] Arrival date & time: 07/10/24  0830     Patient presents with: Rash   Jeremiah [name] is a 43 y.o. male.   Patient is a 43 year old male with a history of hypertension, GERD, chronic kidney disease with creatinine between 2.4 and 2.9 who is presenting today with complaints of allergic reaction.  Patient reports last night was the first time he slept in a new apartment and after going to bed he woke up with diffuse hives on the upper chest and body with itching and some shortness of breath.  He reports getting up and putting on calamine lotion and using another type of lotion but ultimately felt better and went back to sleep but then woke up again with hives and went into the bathroom this morning and had an episode of emesis.  He reports feeling nauseated now and still feeling itching but better than after he got out of the apartment.  He has no shortness of breath or feeling like his throat is closing.  Denies prior history of allergic reaction.  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  He does occasionally have allergies but takes Benadryl as needed.  The history is provided by the patient.  Rash      Prior to Admission medications   Medication Sig Start Date End Date Taking? Authorizing Provider  ondansetron  (ZOFRAN -ODT) 4 MG disintegrating tablet Take 1 tablet (4 mg total) by mouth every 8 (eight) hours as needed for nausea or vomiting. 07/10/24  Yes Janey Petron, Benton, MD  Accu-Chek Softclix Lancets lancets Use to test blood sugar twice daily. Dx: E11.69 11/05/22   Ngetich, Dinah C, NP  acetaminophen (TYLENOL) 500 MG tablet Take 1 tablet (500 mg total) by mouth every 8 (eight) hours as needed. 06/24/24   Ngetich, Dinah C, NP  albuterol  (VENTOLIN  HFA) 108 (90 Base) MCG/ACT inhaler INHALE 1-2 PUFFS BY MOUTH EVERY 6 HOURS  AS NEEDED FOR WHEEZE OR SHORTNESS OF BREATH 09/30/23   Ngetich, Dinah C, NP  amLODipine  (NORVASC ) 10 MG tablet TAKE 1 TABLET BY MOUTH EVERY DAY 06/29/24   Ngetich, Dinah C, NP  atorvastatin  (LIPITOR) 40 MG tablet TAKE 1 TABLET BY MOUTH EVERY DAY 02/20/24   Ngetich, Dinah C, NP  Blood Glucose Monitoring Suppl (ACCU-CHEK AVIVA PLUS) w/Device KIT Use to test blood sugar twice daily. Dx: E11.69 11/05/22   Ngetich, Dinah C, NP  busPIRone  (BUSPAR ) 15 MG tablet TAKE 1 TABLET BY MOUTH TWICE A DAY 03/16/24   Medina-Vargas, Monina C, NP  carvedilol  (COREG ) 6.25 MG tablet TAKE 1 TABLET(6.25 MG) BY MOUTH TWICE DAILY WITH A MEAL 01/22/24   Ngetich, Dinah C, NP  cetirizine  (ZYRTEC ) 10 MG tablet Take 1 tablet (10 mg total) by mouth daily. 11/21/21   Passmore, Christain I, NP  diphenhydrAMINE (BENADRYL) 25 MG tablet Take 25 mg by mouth every 8 (eight) hours as needed for itching or allergies.     [provider]  glimepiride  (AMARYL ) 1 MG tablet TAKE 1 TABLET BY MOUTH DAILY WITH BREAKFAST. 04/01/24   Ngetich, Dinah C, NP  glucose blood (ACCU-CHEK AVIVA PLUS) test strip Use to test blood sugar twice daily. Dx: E11.69 11/05/22   Ngetich, Dinah C, NP  lisinopril  (ZESTRIL ) 30 MG tablet TAKE 1 TABLET(30 MG) BY MOUTH DAILY 02/03/24   Ngetich, Dinah C, NP  montelukast  (SINGULAIR ) 10 MG tablet TAKE 1 TABLET BY MOUTH EVERYDAY AT BEDTIME 03/30/24   Ngetich, Dinah C, NP  omeprazole  (PRILOSEC) 20 MG capsule TAKE 1 CAPSULE (20 MG TOTAL) BY MOUTH DAILY AS NEEDED FOR ACID REFLUX 02/26/24   Ngetich, Dinah C, NP  potassium chloride  SA (KLOR-CON  M) 20 MEQ tablet Take 1 tablet (20 mEq total) by mouth daily. 06/24/24   Ngetich, Dinah C, NP  Vitamin D , Ergocalciferol , (DRISDOL ) 1.25 MG (50000 UNIT) CAPS capsule TAKE 1 CAPSULE EVERY 7 DAYS 10/31/23   Ngetich, Dinah C, NP    Allergies: Patient has no known allergies.    Review of Systems  Skin:  Positive for rash.    Updated Vital Signs BP 131/87 (BP Location: Right Arm)   Pulse 80    Temp 98.1 F (36.7 C) (Oral)   Resp 16   SpO2 97%   Physical Exam Vitals and nursing note reviewed.  Constitutional:      General: He is not in acute distress.    Appearance: He is well-developed.  HENT:     Head: Normocephalic and atraumatic.     Mouth/Throat:     Comments: Pharynx is clear without any edema noted.  No voice changing or stridor Eyes:     Conjunctiva/sclera: Conjunctivae normal.     Pupils: Pupils are equal, round, and reactive to light.  Cardiovascular:     Rate and Rhythm: Normal rate and regular rhythm.     Heart sounds: No murmur heard. Pulmonary:     Effort: Pulmonary effort is normal. No respiratory distress.     Breath sounds: Normal breath sounds. No wheezing or rales.  Abdominal:     General: There is no distension.     Palpations: Abdomen is soft.     Tenderness: There is no abdominal tenderness. There is no guarding or rebound.  Musculoskeletal:        General: No tenderness. Normal range of motion.     Cervical back: Normal range of motion and neck supple.  Skin:    General: Skin is warm and dry.     Findings: No erythema or rash.     Comments: No obvious hives at this time  Neurological:     Mental Status: He is alert and oriented to person, place, and time.  Psychiatric:        Behavior: Behavior normal.     (all labs ordered are listed, but only abnormal results are displayed) Labs Reviewed - No data to display  EKG: None  Radiology: No results found.   Procedures   Medications Ordered in the ED  ondansetron  (ZOFRAN -ODT) disintegrating tablet 4 mg (has no administration in time range)  loratadine (CLARITIN) tablet 10 mg (has no administration in time range)                                    Medical Decision Making Risk OTC drugs. Prescription drug management.   Patient presenting today with symptoms concerning for an allergic reaction.  Unclear exactly what the irritant is but may be something in the new bed or  something his sheets were laundered in.  No airway compromise at this time.  Patient given Zofran  for nausea and loratadine.  At this time patient does not appear to need epi or systemic steroids. Patient improved after Claritin and Zofran  at this time he is stable for discharge.     Final  diagnoses:  Allergic reaction, initial encounter    ED Discharge Orders          Ordered    ondansetron  (ZOFRAN -ODT) 4 MG disintegrating tablet  Every 8 hours PRN        07/10/24 9047               Doretha Folks, MD 07/10/24 [phone]

## 2024-07-10 NOTE — ED Triage Notes (Signed)
 Reports rash and hives on upper body. States first night in new apartment Denies any respiratory involvement.

## 2024-07-16 ENCOUNTER — Other Ambulatory Visit: Payer: Self-pay | Admitting: Family

## 2024-07-16 DIAGNOSIS — I129 Hypertensive chronic kidney disease with stage 1 through stage 4 chronic kidney disease, or unspecified chronic kidney disease: Secondary | ICD-10-CM

## 2024-08-17 ENCOUNTER — Other Ambulatory Visit: Payer: Self-pay | Admitting: Family

## 2024-08-17 DIAGNOSIS — K219 Gastro-esophageal reflux disease without esophagitis: Secondary | ICD-10-CM

## 2024-09-02 ENCOUNTER — Other Ambulatory Visit: Payer: Self-pay | Admitting: Family

## 2024-09-02 DIAGNOSIS — I129 Hypertensive chronic kidney disease with stage 1 through stage 4 chronic kidney disease, or unspecified chronic kidney disease: Secondary | ICD-10-CM

## 2024-10-04 ENCOUNTER — Other Ambulatory Visit: Payer: Self-pay | Admitting: Family

## 2024-10-04 DIAGNOSIS — J4541 Moderate persistent asthma with (acute) exacerbation: Secondary | ICD-10-CM

## 2024-10-05 ENCOUNTER — Other Ambulatory Visit: Payer: Self-pay | Admitting: Family

## 2024-10-05 DIAGNOSIS — I129 Hypertensive chronic kidney disease with stage 1 through stage 4 chronic kidney disease, or unspecified chronic kidney disease: Secondary | ICD-10-CM

## 2024-12-23 ENCOUNTER — Ambulatory Visit: Payer: Self-pay | Admitting: Family
